# Patient Record
Sex: Female | Born: 1960 | Race: Black or African American | Hispanic: No | Smoking: Never smoker
Health system: Southern US, Community
[De-identification: ages and names within clinical notes are randomized; demographics above are authoritative.]

## PROBLEM LIST (undated history)

## (undated) DIAGNOSIS — R262 Difficulty in walking, not elsewhere classified: Secondary | ICD-10-CM

## (undated) DIAGNOSIS — F79 Unspecified intellectual disabilities: Secondary | ICD-10-CM

## (undated) DIAGNOSIS — I10 Essential (primary) hypertension: Secondary | ICD-10-CM

## (undated) DIAGNOSIS — F209 Schizophrenia, unspecified: Secondary | ICD-10-CM

## (undated) DIAGNOSIS — J9610 Chronic respiratory failure, unspecified whether with hypoxia or hypercapnia: Secondary | ICD-10-CM

## (undated) DIAGNOSIS — E669 Obesity, unspecified: Secondary | ICD-10-CM

## (undated) DIAGNOSIS — E119 Type 2 diabetes mellitus without complications: Secondary | ICD-10-CM

## (undated) DIAGNOSIS — R569 Unspecified convulsions: Secondary | ICD-10-CM

## (undated) DIAGNOSIS — I509 Heart failure, unspecified: Secondary | ICD-10-CM

## (undated) DIAGNOSIS — N390 Urinary tract infection, site not specified: Secondary | ICD-10-CM

## (undated) DIAGNOSIS — R489 Unspecified symbolic dysfunctions: Secondary | ICD-10-CM

## (undated) DIAGNOSIS — E785 Hyperlipidemia, unspecified: Secondary | ICD-10-CM

## (undated) DIAGNOSIS — R1310 Dysphagia, unspecified: Secondary | ICD-10-CM

## (undated) DIAGNOSIS — M6281 Muscle weakness (generalized): Secondary | ICD-10-CM

## (undated) HISTORY — PX: ENDOTRACHEAL INTUBATION EMERGENT: SUR448

## (undated) HISTORY — PX: TRACHEOSTOMY: SUR1362

---

## 2013-07-02 ENCOUNTER — Inpatient Hospital Stay (HOSPITAL_COMMUNITY)
Admission: RE | Admit: 2013-07-02 | Discharge: 2013-07-02 | Disposition: A | Discharge status: Home / Self Care | Payer: Medicare Other | Source: Ambulatory Visit

## 2013-07-02 DIAGNOSIS — G9340 Encephalopathy, unspecified: Secondary | ICD-10-CM

## 2013-07-06 DIAGNOSIS — G9340 Encephalopathy, unspecified: Secondary | ICD-10-CM | POA: Insufficient documentation

## 2013-07-06 NOTE — Procedures (Signed)
ELECTROENCEPHALOGRAM REPORT   Patient: CHAELYN BUNYAN       Room #: [facility] 417 EEG No. ID: [id number] Age: 52 y.o.        Sex: female Referring Physician: In a Report Date:  07/06/2013        Interpreting Physician: Aline Brochure  History: EMILLIE CHASEN is an 52 y.o. female with ventilator dependent respiratory failure, diabetes mellitus, diastolic heart failure, hypertension and obesity, with possible recurrent seizure activity.  Indications for study:  Rule out seizure activity.  Technique: This is an 18 channel routine scalp EEG performed at the bedside with bipolar and monopolar montages arranged in accordance to the international 10/20 system of electrode placement.   Description: Patient was on ventilatory support by way of tracheostomy. Background EEG activity consisted of low amplitude diffuse symmetrical irregular 1-2 Hz delta activity with intermittent superimposed brief runs of generalized 4-5 Hz theta activity. Photic stimulation was not performed. No epileptiform discharges recorded. Patient was noted to become agitated with external noxious stimuli. Significant muscle artifact associated with facial grimacing was noted. There was no simultaneous change in background cerebral activity.  Interpretation: EEG is abnormal with generalized continuous severe slowing of cerebral activity consistent with severe encephalopathy. No evidence of epileptic activity was demonstrated.   Venetia Maxon M.D. [facility] [phone]

## 2013-10-19 ENCOUNTER — Emergency Department (HOSPITAL_COMMUNITY): Payer: Medicare Other

## 2013-10-19 ENCOUNTER — Encounter (HOSPITAL_COMMUNITY): Payer: Self-pay | Admitting: Emergency Medicine

## 2013-10-19 ENCOUNTER — Emergency Department (HOSPITAL_COMMUNITY)
Admission: EM | Admit: 2013-10-19 | Discharge: 2013-10-19 | Disposition: A | Discharge status: Skilled Nursing Facility | Payer: Medicare Other | Attending: Emergency Medicine | Admitting: Emergency Medicine

## 2013-10-19 DIAGNOSIS — F79 Unspecified intellectual disabilities: Secondary | ICD-10-CM | POA: Insufficient documentation

## 2013-10-19 DIAGNOSIS — Z7983 Long term (current) use of bisphosphonates: Secondary | ICD-10-CM | POA: Insufficient documentation

## 2013-10-19 DIAGNOSIS — F209 Schizophrenia, unspecified: Secondary | ICD-10-CM | POA: Insufficient documentation

## 2013-10-19 DIAGNOSIS — Y9389 Activity, other specified: Secondary | ICD-10-CM | POA: Insufficient documentation

## 2013-10-19 DIAGNOSIS — W19XXXA Unspecified fall, initial encounter: Secondary | ICD-10-CM

## 2013-10-19 DIAGNOSIS — Z79899 Other long term (current) drug therapy: Secondary | ICD-10-CM | POA: Insufficient documentation

## 2013-10-19 DIAGNOSIS — I10 Essential (primary) hypertension: Secondary | ICD-10-CM | POA: Insufficient documentation

## 2013-10-19 DIAGNOSIS — J9610 Chronic respiratory failure, unspecified whether with hypoxia or hypercapnia: Secondary | ICD-10-CM | POA: Insufficient documentation

## 2013-10-19 DIAGNOSIS — G40909 Epilepsy, unspecified, not intractable, without status epilepticus: Secondary | ICD-10-CM | POA: Insufficient documentation

## 2013-10-19 DIAGNOSIS — Z930 Tracheostomy status: Secondary | ICD-10-CM | POA: Insufficient documentation

## 2013-10-19 DIAGNOSIS — E119 Type 2 diabetes mellitus without complications: Secondary | ICD-10-CM | POA: Insufficient documentation

## 2013-10-19 DIAGNOSIS — Z87440 Personal history of urinary (tract) infections: Secondary | ICD-10-CM | POA: Insufficient documentation

## 2013-10-19 DIAGNOSIS — Z993 Dependence on wheelchair: Secondary | ICD-10-CM | POA: Insufficient documentation

## 2013-10-19 DIAGNOSIS — Y9210 Unspecified residential institution as the place of occurrence of the external cause: Secondary | ICD-10-CM | POA: Insufficient documentation

## 2013-10-19 DIAGNOSIS — W050XXA Fall from non-moving wheelchair, initial encounter: Secondary | ICD-10-CM | POA: Insufficient documentation

## 2013-10-19 DIAGNOSIS — E669 Obesity, unspecified: Secondary | ICD-10-CM | POA: Insufficient documentation

## 2013-10-19 DIAGNOSIS — I509 Heart failure, unspecified: Secondary | ICD-10-CM | POA: Insufficient documentation

## 2013-10-19 DIAGNOSIS — S3981XA Other specified injuries of abdomen, initial encounter: Secondary | ICD-10-CM | POA: Insufficient documentation

## 2013-10-19 HISTORY — DX: Chronic respiratory failure, unspecified whether with hypoxia or hypercapnia: J96.10

## 2013-10-19 HISTORY — DX: Heart failure, unspecified: I50.9

## 2013-10-19 HISTORY — DX: Muscle weakness (generalized): M62.81

## 2013-10-19 HISTORY — DX: Schizophrenia, unspecified: F20.9

## 2013-10-19 HISTORY — DX: Hyperlipidemia, unspecified: E78.5

## 2013-10-19 HISTORY — DX: Dysphagia, unspecified: R13.10

## 2013-10-19 HISTORY — DX: Urinary tract infection, site not specified: N39.0

## 2013-10-19 HISTORY — DX: Type 2 diabetes mellitus without complications: E11.9

## 2013-10-19 HISTORY — DX: Unspecified symbolic dysfunctions: R48.9

## 2013-10-19 HISTORY — DX: Obesity, unspecified: E66.9

## 2013-10-19 HISTORY — DX: Unspecified intellectual disabilities: F79

## 2013-10-19 HISTORY — DX: Essential (primary) hypertension: I10

## 2013-10-19 HISTORY — DX: Unspecified convulsions: R56.9

## 2013-10-19 HISTORY — DX: Difficulty in walking, not elsewhere classified: R26.2

## 2013-10-19 MED ORDER — KETOROLAC TROMETHAMINE 60 MG/2ML IM SOLN
60.0000 mg | Freq: Once | INTRAMUSCULAR | Status: AC
  Administered 2013-10-19: 60 mg via INTRAMUSCULAR
  Filled 2013-10-19: qty 2

## 2013-10-19 NOTE — ED Notes (Signed)
Pt sitting at the end of bed. Attempted to move pt in bed to lay down. Pt refused. Pt family states that pt is fine sitting at the end of bed. Notified pt family that pt would be safer to lay down. Pt daughter's states that pt is ok where she is. Pt VSS

## 2013-10-19 NOTE — ED Notes (Addendum)
Pt from [facility] via [facility]. Per EMS, pt was in wheelchair, clipped rug on floor and fell out of wheel chair. Pt has no visible injury,did not hit head, no LOC. Pt c/o abd pain after fall. Pt has hx of dysfunctional schizophrenia. Pt acting her normal per facility, crying and whining. Pt is on 2L O2 at all times. Pt in NAD

## 2013-10-19 NOTE — Discharge Instructions (Signed)
Follow up as needed

## 2013-10-19 NOTE — ED Notes (Signed)
[facility] called to transport pt back to facility 

## 2013-10-19 NOTE — ED Notes (Signed)
Pt daughter states that pt is mad because daughter refused to come visit with her today. Daughter states that this is the type of behavior that pt does when she doesn't get her way. Pt daughter also states that pt accuses everyone of harming/threatning her when she doesn't get her way

## 2013-10-19 NOTE — ED Notes (Signed)
Bed: WA18 Expected date:  Expected time:  Means of arrival:  Comments: EMS-fall 

## 2013-10-19 NOTE — ED Notes (Signed)
Pt from [facility]. Pt states that she is having stomach pain after falling onto her knees from whellchair. Pt constantly whining that "she doesn't want to go back, they are mean to me, [place] go to [place] This is normal behavior per EMS from facility. Pt answers questions name and DOB correctly. Pt is in NAD

## 2013-10-19 NOTE — Progress Notes (Signed)
   CARE MANAGEMENT ED NOTE 10/19/2013  Patient:  Theresa Wiley,Theresa Wiley   Account Number:  [id number]  Date Initiated:  10/19/2013  Documentation initiated by:  Edd ArbourGIBBS,KIMBERLY  Subjective/Objective Assessment:   53 yr old female medicare pt without pcp listed Pt states pcp is Theresa Wiley [facility] c/o fall , abdominal pain imaging shows burden of stool HR 103 placed on 2 L O2     Subjective/Objective Assessment Detail:     Action/Plan:   EPIc updated   Action/Plan Detail:   Anticipated DC Date:  10/19/2013     Status Recommendation to Physician:   Result of Recommendation:    Other ED Services  Consult Working Plan    DC Planning Services  Other  PCP issues  Outpatient Services - Pt will follow up    Choice offered to / List presented to:            Status of service:  Completed, signed off  ED Comments:   ED Comments Detail:

## 2013-10-19 NOTE — ED Provider Notes (Signed)
CSN: [id number]     Arrival date & time 10/19/13  0932 History   First MD Initiated Contact with Patient 10/19/13 [phone]     Chief Complaint  Patient presents with  . Fall  . Abdominal Pain   (Consider location/radiation/quality/duration/timing/severity/associated sxs/prior Treatment) Patient is a 53 y.o. female presenting with fall and abdominal pain. The history is provided by the patient (the pt fell out of her wheel chair and has abd pain).  Fall This is a new problem. The current episode started less than 1 hour ago. The problem occurs rarely. The problem has not changed since onset.Associated symptoms include abdominal pain. Pertinent negatives include no chest pain and no headaches. Nothing aggravates the symptoms. Nothing relieves the symptoms.  Abdominal Pain Associated symptoms: no chest pain, no cough, no diarrhea, no fatigue and no hematuria     Past Medical History  Diagnosis Date  . Chronic respiratory failure   . UTI (urinary tract infection)   . CHF (congestive heart failure)   . Ambulatory dysfunction   . Symbolic dysfunction   . Schizophrenia   . Dysphagia   . Diabetes mellitus without complication   . Generalized muscle weakness   . Hypertension   . Hyperlipidemia   . Obesity   . Intellectual disability   . Seizures    Past Surgical History  Procedure Laterality Date  . Tracheostomy    . Endotracheal intubation emergent     No family history on file. History  Substance Use Topics  . Smoking status: Never Smoker   . Smokeless tobacco: Not on file  . Alcohol Use: No   OB History   Grav Para Term Preterm Abortions TAB SAB Ect Mult Living                 Review of Systems  Constitutional: Negative for appetite change and fatigue.  HENT: Negative for congestion, ear discharge and sinus pressure.   Eyes: Negative for discharge.  Respiratory: Negative for cough.   Cardiovascular: Negative for chest pain.  Gastrointestinal: Positive for abdominal pain.  Negative for diarrhea.  Genitourinary: Negative for frequency and hematuria.  Musculoskeletal: Negative for back pain.  Skin: Negative for rash.  Neurological: Negative for seizures and headaches.  Psychiatric/Behavioral: Negative for hallucinations.    Allergies  Review of patient's allergies indicates no known allergies.  Home Medications   Current Outpatient Rx  Name  Route  Sig  Dispense  Refill  . alendronate (FOSAMAX) 10 MG tablet   Oral   Take 10 mg by mouth daily before breakfast. Take with a full glass of water on an empty stomach.         . clonazePAM (KLONOPIN) 1 MG tablet   Oral   Take 1 mg by mouth every 8 (eight) hours.         Marland Kitchen. glycopyrrolate (ROBINUL) 1 MG tablet   Oral   Take 2 mg by mouth every 8 (eight) hours.         Marland Kitchen. lamoTRIgine (LAMICTAL) 200 MG tablet   Oral   Take 200 mg by mouth 2 (two) times daily.         Marland Kitchen. levETIRAcetam (KEPPRA) 750 MG tablet   Oral   Take 750 mg by mouth 2 (two) times daily.         Marland Kitchen. lurasidone (LATUDA) 80 MG TABS tablet   Oral   Take 80 mg by mouth daily.         Marland Kitchen. nystatin (MYCOSTATIN) 100000  UNIT/ML suspension   Oral   Take 5 mLs by mouth every 8 (eight) hours.         . ondansetron (ZOFRAN-ODT) 4 MG disintegrating tablet   Oral   Take 4 mg by mouth every 6 (six) hours as needed for nausea or vomiting.         . Oxycodone HCl 10 MG TABS   Oral   Take 10 mg by mouth every 6 (six) hours as needed (pain).         . potassium chloride (KLOR-CON) 20 MEQ packet   Oral   Take 20 mEq by mouth daily.         . sertraline (ZOLOFT) 100 MG tablet   Oral   Take 200 mg by mouth daily.         Marland Kitchen tolterodine (DETROL) 2 MG tablet   Oral   Take 2 mg by mouth 2 (two) times daily.          BP 110/79  Pulse 103  Temp(Src) 98.2 F (36.8 C) (Oral)  Resp 20  SpO2 100% Physical Exam  Constitutional: She is oriented to person, place, and time. She appears well-developed.  HENT:  Head:  Normocephalic.  Eyes: Conjunctivae and EOM are normal. No scleral icterus.  Neck: Neck supple. No thyromegaly present.  Cardiovascular: Normal rate and regular rhythm.  Exam reveals no gallop and no friction rub.   No murmur heard. Pulmonary/Chest: No stridor. She has no wheezes. She has no rales. She exhibits no tenderness.  Abdominal: She exhibits no distension. There is tenderness. There is no rebound.  Minor epigastric tender  Musculoskeletal: Normal range of motion. She exhibits no edema.  Lymphadenopathy:    She has no cervical adenopathy.  Neurological: She is oriented to person, place, and time. She exhibits normal muscle tone. Coordination normal.  Skin: No rash noted. No erythema.  Psychiatric: She has a normal mood and affect. Her behavior is normal.    ED Course  Procedures (including critical care time) Labs Review Labs Reviewed - No data to display Imaging Review Dg Abd Acute W/chest  10/19/2013   CLINICAL DATA:  Abdominal pain.  EXAM: ACUTE ABDOMEN SERIES (ABDOMEN 2 VIEW & CHEST 1 VIEW)  COMPARISON:  None.  FINDINGS: Cardiomegaly with vascular congestion. Diffuse interstitial prominence throughout the lungs may reflect interstitial edema. No effusions.  Nonobstructive bowel gas pattern. Moderate stool burden throughout the colon. No free air organomegaly. No suspicious calcifications. No acute bony abnormality.  IMPRESSION: Moderate stool burden. No evidence of bowel obstruction or free air.  Cardiomegaly with vascular congestion and possible interstitial edema.   Electronically Signed   By: Charlett Nose M.D.   On: 10/19/2013 10:40    EKG Interpretation   None       MDM   1. Fall, initial encounter    At discharge she no longer had abd pain or tenderness    Benny Lennert, MD 10/19/13 1128

## 2013-10-30 ENCOUNTER — Emergency Department (HOSPITAL_COMMUNITY): Payer: Medicare Other

## 2013-10-30 ENCOUNTER — Emergency Department (HOSPITAL_COMMUNITY)
Admission: EM | Admit: 2013-10-30 | Discharge: 2013-11-06 | Disposition: A | Discharge status: Home / Self Care | Payer: Medicare Other | Attending: Emergency Medicine | Admitting: Emergency Medicine

## 2013-10-30 DIAGNOSIS — J9610 Chronic respiratory failure, unspecified whether with hypoxia or hypercapnia: Secondary | ICD-10-CM | POA: Insufficient documentation

## 2013-10-30 DIAGNOSIS — E669 Obesity, unspecified: Secondary | ICD-10-CM | POA: Insufficient documentation

## 2013-10-30 DIAGNOSIS — F1310 Sedative, hypnotic or anxiolytic abuse, uncomplicated: Secondary | ICD-10-CM | POA: Insufficient documentation

## 2013-10-30 DIAGNOSIS — I509 Heart failure, unspecified: Secondary | ICD-10-CM | POA: Insufficient documentation

## 2013-10-30 DIAGNOSIS — Z79899 Other long term (current) drug therapy: Secondary | ICD-10-CM | POA: Insufficient documentation

## 2013-10-30 DIAGNOSIS — E785 Hyperlipidemia, unspecified: Secondary | ICD-10-CM | POA: Insufficient documentation

## 2013-10-30 DIAGNOSIS — Z228 Carrier of other infectious diseases: Secondary | ICD-10-CM

## 2013-10-30 DIAGNOSIS — F29 Unspecified psychosis not due to a substance or known physiological condition: Secondary | ICD-10-CM | POA: Insufficient documentation

## 2013-10-30 DIAGNOSIS — R4189 Other symptoms and signs involving cognitive functions and awareness: Secondary | ICD-10-CM

## 2013-10-30 DIAGNOSIS — E119 Type 2 diabetes mellitus without complications: Secondary | ICD-10-CM | POA: Insufficient documentation

## 2013-10-30 DIAGNOSIS — F209 Schizophrenia, unspecified: Secondary | ICD-10-CM

## 2013-10-30 DIAGNOSIS — Z87440 Personal history of urinary (tract) infections: Secondary | ICD-10-CM | POA: Insufficient documentation

## 2013-10-30 DIAGNOSIS — F79 Unspecified intellectual disabilities: Secondary | ICD-10-CM | POA: Insufficient documentation

## 2013-10-30 DIAGNOSIS — G40909 Epilepsy, unspecified, not intractable, without status epilepticus: Secondary | ICD-10-CM | POA: Insufficient documentation

## 2013-10-30 DIAGNOSIS — G9340 Encephalopathy, unspecified: Secondary | ICD-10-CM

## 2013-10-30 DIAGNOSIS — R8271 Bacteriuria: Secondary | ICD-10-CM

## 2013-10-30 DIAGNOSIS — I10 Essential (primary) hypertension: Secondary | ICD-10-CM | POA: Insufficient documentation

## 2013-10-30 DIAGNOSIS — IMO0002 Reserved for concepts with insufficient information to code with codable children: Secondary | ICD-10-CM | POA: Insufficient documentation

## 2013-10-30 DIAGNOSIS — Z113 Encounter for screening for infections with a predominantly sexual mode of transmission: Secondary | ICD-10-CM

## 2013-10-30 DIAGNOSIS — F3289 Other specified depressive episodes: Secondary | ICD-10-CM | POA: Insufficient documentation

## 2013-10-30 DIAGNOSIS — F329 Major depressive disorder, single episode, unspecified: Secondary | ICD-10-CM | POA: Insufficient documentation

## 2013-10-30 LAB — COMPREHENSIVE METABOLIC PANEL
ALT: 9 U/L (ref 0–35)
AST: 12 U/L (ref 0–37)
Albumin: 3.5 g/dL (ref 3.5–5.2)
Alkaline Phosphatase: 82 U/L (ref 39–117)
BUN: 11 mg/dL (ref 6–23)
CALCIUM: 9.4 mg/dL (ref 8.4–10.5)
CO2: 31 meq/L (ref 19–32)
Chloride: 99 mEq/L (ref 96–112)
Creatinine, Ser: 0.82 mg/dL (ref 0.50–1.10)
GFR, EST NON AFRICAN AMERICAN: 80 mL/min — AB (ref >90)
GLUCOSE: 92 mg/dL (ref 70–99)
Potassium: 4.5 mEq/L (ref 3.7–5.3)
Sodium: 140 mEq/L (ref 137–147)
Total Bilirubin: <0.2 mg/dL — ABNORMAL LOW (ref 0.3–1.2)
Total Protein: 7.4 g/dL (ref 6.0–8.3)

## 2013-10-30 LAB — URINALYSIS, ROUTINE W REFLEX MICROSCOPIC
BILIRUBIN URINE: NEGATIVE
Glucose, UA: NEGATIVE mg/dL
HGB URINE DIPSTICK: NEGATIVE
KETONES UR: NEGATIVE mg/dL
Nitrite: NEGATIVE
PROTEIN: NEGATIVE mg/dL
Specific Gravity, Urine: 1.018 (ref 1.005–1.030)
Urobilinogen, UA: 0.2 mg/dL (ref 0.0–1.0)
pH: 6.5 (ref 5.0–8.0)

## 2013-10-30 LAB — CBC WITH DIFFERENTIAL/PLATELET
Basophils Absolute: 0 10*3/uL (ref 0.0–0.1)
Basophils Relative: 0 % (ref 0–1)
Eosinophils Absolute: 0.3 10*3/uL (ref 0.0–0.7)
Eosinophils Relative: 6 % — ABNORMAL HIGH (ref 0–5)
HCT: 34.1 % — ABNORMAL LOW (ref 36.0–46.0)
HEMOGLOBIN: 10.3 g/dL — AB (ref 12.0–15.0)
LYMPHS ABS: 0.9 10*3/uL (ref 0.7–4.0)
LYMPHS PCT: 16 % (ref 12–46)
MCH: 24.7 pg — ABNORMAL LOW (ref 26.0–34.0)
MCHC: 30.2 g/dL (ref 30.0–36.0)
MCV: 81.8 fL (ref 78.0–100.0)
MONO ABS: 0.5 10*3/uL (ref 0.1–1.0)
Monocytes Relative: 9 % (ref 3–12)
Neutro Abs: 4 10*3/uL (ref 1.7–7.7)
Neutrophils Relative %: 70 % (ref 43–77)
PLATELETS: 241 10*3/uL (ref 150–400)
RBC: 4.17 MIL/uL (ref 3.87–5.11)
RDW: 15.4 % (ref 11.5–15.5)
WBC: 5.8 10*3/uL (ref 4.0–10.5)

## 2013-10-30 LAB — URINE MICROSCOPIC-ADD ON

## 2013-10-30 LAB — GLUCOSE, CAPILLARY: Glucose-Capillary: 93 mg/dL (ref 70–99)

## 2013-10-30 LAB — RAPID URINE DRUG SCREEN, HOSP PERFORMED
AMPHETAMINES: NOT DETECTED
BARBITURATES: NOT DETECTED
Benzodiazepines: POSITIVE — AB
Cocaine: NOT DETECTED
Opiates: NOT DETECTED
TETRAHYDROCANNABINOL: NOT DETECTED

## 2013-10-30 LAB — SALICYLATE LEVEL

## 2013-10-30 LAB — ETHANOL: Alcohol, Ethyl (B): <11 mg/dL (ref 0–11)

## 2013-10-30 LAB — ACETAMINOPHEN LEVEL

## 2013-10-30 LAB — PRO B NATRIURETIC PEPTIDE: Pro B Natriuretic peptide (BNP): 303.9 pg/mL — ABNORMAL HIGH (ref 0–125)

## 2013-10-30 MED ORDER — SERTRALINE HCL 50 MG PO TABS
200.0000 mg | ORAL_TABLET | Freq: Every day | ORAL | Status: DC
  Administered 2013-10-31 – 2013-11-06 (×7): 200 mg via ORAL
  Filled 2013-10-30 (×8): qty 4

## 2013-10-30 MED ORDER — OXYCODONE HCL 5 MG PO TABS
10.0000 mg | ORAL_TABLET | Freq: Four times a day (QID) | ORAL | Status: DC | PRN
  Administered 2013-10-31: 10 mg via ORAL
  Filled 2013-10-30: qty 2

## 2013-10-30 MED ORDER — LURASIDONE HCL 80 MG PO TABS
80.0000 mg | ORAL_TABLET | Freq: Every day | ORAL | Status: DC
  Administered 2013-10-31 – 2013-11-06 (×7): 80 mg via ORAL
  Filled 2013-10-30 (×8): qty 1

## 2013-10-30 MED ORDER — LAMOTRIGINE 200 MG PO TABS
200.0000 mg | ORAL_TABLET | Freq: Two times a day (BID) | ORAL | Status: DC
  Administered 2013-10-31 – 2013-11-06 (×14): 200 mg via ORAL
  Filled 2013-10-30 (×18): qty 1

## 2013-10-30 MED ORDER — CLONAZEPAM 0.5 MG PO TABS
1.0000 mg | ORAL_TABLET | Freq: Three times a day (TID) | ORAL | Status: DC
  Administered 2013-10-31 – 2013-11-06 (×19): 1 mg via ORAL
  Filled 2013-10-30 (×22): qty 2

## 2013-10-30 MED ORDER — ONDANSETRON 4 MG PO TBDP
4.0000 mg | ORAL_TABLET | Freq: Four times a day (QID) | ORAL | Status: DC | PRN
  Filled 2013-10-30: qty 1

## 2013-10-30 MED ORDER — LEVETIRACETAM 750 MG PO TABS
750.0000 mg | ORAL_TABLET | Freq: Two times a day (BID) | ORAL | Status: DC
  Administered 2013-10-31 – 2013-11-06 (×14): 750 mg via ORAL
  Filled 2013-10-30 (×16): qty 1

## 2013-10-30 NOTE — ED Notes (Signed)
Pt here brought by daughters attempting to get pt placed in [facility].  Hx of Schizophrenia, but family states that is has got progressively worse in last 1.5 months. States that pt moans all night, is having active hallucinations and will not calm down unless her daughters are present. Family wanting to have pt admitted to this facility to stabilize her medications. Family states that pt doesn't express SI to them, but will "hit staff, but only when she is frustrated. It isn't because she wants to hurt anyone." Pt refusing to answer questions for this RN, but will follow commands.

## 2013-10-30 NOTE — ED Notes (Signed)
Notified RN of CBG 93

## 2013-10-30 NOTE — ED Notes (Signed)
PER PT DAUGHTER PT IS CONFINED TO A WHEELCHAIR AT THE NURSING HOME.

## 2013-10-30 NOTE — ED Notes (Signed)
Family at bedside. 

## 2013-10-30 NOTE — ED Notes (Signed)
Transporting patient to new room assignment. 

## 2013-10-30 NOTE — ED Notes (Signed)
Pt's daughters states the pt has not been sleeping well.

## 2013-10-30 NOTE — ED Notes (Addendum)
SPOKE WITH PORCHA AT [facility]. SHE ADVISES THAT SHE UNDERSTOOD THE PT WAS BEING TAKEN TO THE HOSPITAL TONIGHT TO FACILITATE HER GOING [facility]. THE PT WOULD THEN RETURN TO THEIR FACILITY WHEN HER MEDS WERE STABLEIZED. [phone])

## 2013-10-30 NOTE — ED Notes (Signed)
Spoke with Theresa Wiley at [facility] and informed him of the pt's current situation.

## 2013-10-30 NOTE — BH Assessment (Addendum)
[facility] Assessment Progress Note      Spoke to Dr Oletta CohnPolina about the patient prior to TTS consult-HIstory of paranoid schizophrenia, acting out, beligerent, daughter cares for her, but when her daughter is away the patient's behavior becomes worse.  They would like to get her into another facility, but need a referral to do so.

## 2013-10-30 NOTE — ED Notes (Addendum)
CALLED PT NP JULIE [phone]). SHE ADVISES THAT SHE SPENT A NUMBER OF HOURS TODAY TRYING  TO GET PT MOVED TO [facility] [place]. STATES THE "ON CALL DOCTOR" SAID THEY COULD NOT ACCEPT PT FROM THE NURSING HOME HE SUGGESTS SENDING HER TO THE HOSPITAL AND HAVING THE HOSPITAL WORK OUT THE TRANSFER TO THEIR FACILITY. CALLED [facility] AND SPOKE TO CHARGE NURSE AMANDA.([phone]) AND SHE AGAIN STATES THAT THEY ARE NOT AWARE OF THIS CONVERSATION OR THIS PATIENT. SHE STATES THEY DO HAVE BEDS OPEN AND AVAILABLE AT THIS TIME AND REQUESTS WE SEND HER A REFERRAL PACKET. [phone] FAX)

## 2013-10-30 NOTE — BH Assessment (Signed)
[facility] Assessment Progress Note      Faxed referral to [facility].

## 2013-10-30 NOTE — ED Provider Notes (Signed)
CSN: [id number]     Arrival date & time 10/30/13  1359 History   First MD Initiated Contact with Patient 10/30/13 1402     Chief Complaint  Patient presents with  . Psychiatric Evaluation   (Consider location/radiation/quality/duration/timing/severity/associated sxs/prior Treatment) HPI Comments: History of paranoid schizophrenia to the ER from the nursing facility where she resides for increased agitation and aggression. Patient reportedly has had a significant decline over the past 1-1/2 months. She has been experiencing active hallucinations and increased agitation. Patient is crying, moaning and repeatedly says "I want to go home" on examination. She'll not answer any questions appropriately. Level V Caveat due to schizophrenia.    Past Medical History  Diagnosis Date  . Chronic respiratory failure   . UTI (urinary tract infection)   . CHF (congestive heart failure)   . Ambulatory dysfunction   . Symbolic dysfunction   . Schizophrenia   . Dysphagia   . Diabetes mellitus without complication   . Generalized muscle weakness   . Hypertension   . Hyperlipidemia   . Obesity   . Intellectual disability   . Seizures    Past Surgical History  Procedure Laterality Date  . Tracheostomy    . Endotracheal intubation emergent     No family history on file. History  Substance Use Topics  . Smoking status: Never Smoker   . Smokeless tobacco: Not on file  . Alcohol Use: No   OB History   Grav Para Term Preterm Abortions TAB SAB Ect Mult Living                 Review of Systems  Unable to perform ROS: Psychiatric disorder    Allergies  Review of patient's allergies indicates no known allergies.  Home Medications   Current Outpatient Rx  Name  Route  Sig  Dispense  Refill  . alendronate (FOSAMAX) 10 MG tablet   Oral   Take 10 mg by mouth daily before breakfast. Take with a full glass of water on an empty stomach.         . clonazePAM (KLONOPIN) 1 MG tablet   Oral  Take 1 mg by mouth every 8 (eight) hours.         Marland Kitchen glycopyrrolate (ROBINUL) 1 MG tablet   Oral   Take 2 mg by mouth every 8 (eight) hours.         Marland Kitchen lamoTRIgine (LAMICTAL) 200 MG tablet   Oral   Take 200 mg by mouth 2 (two) times daily.         Marland Kitchen levETIRAcetam (KEPPRA) 750 MG tablet   Oral   Take 750 mg by mouth 2 (two) times daily.         Marland Kitchen lurasidone (LATUDA) 80 MG TABS tablet   Oral   Take 80 mg by mouth daily.         Marland Kitchen nystatin (MYCOSTATIN) 100000 UNIT/ML suspension   Oral   Take 5 mLs by mouth every 8 (eight) hours.         . ondansetron (ZOFRAN-ODT) 4 MG disintegrating tablet   Oral   Take 4 mg by mouth every 6 (six) hours as needed for nausea or vomiting.         . Oxycodone HCl 10 MG TABS   Oral   Take 10 mg by mouth every 6 (six) hours as needed (pain).         . potassium chloride SA (K-DUR,KLOR-CON) 20 MEQ tablet   Oral  Take 20 mEq by mouth daily.         . sertraline (ZOLOFT) 100 MG tablet   Oral   Take 200 mg by mouth daily.         Marland Kitchen. tolterodine (DETROL) 2 MG tablet   Oral   Take 2 mg by mouth 2 (two) times daily.          BP 119/58  Pulse 93  Temp(Src) 98.3 F (36.8 C) (Oral)  Resp 18  SpO2 97% Physical Exam  Constitutional: She is oriented to person, place, and time. She appears well-developed and well-nourished. She appears distressed.  HENT:  Head: Normocephalic and atraumatic.  Right Ear: Hearing normal.  Left Ear: Hearing normal.  Nose: Nose normal.  Mouth/Throat: Oropharynx is clear and moist and mucous membranes are normal.  Eyes: Conjunctivae and EOM are normal. Pupils are equal, round, and reactive to light.  Neck: Normal range of motion. Neck supple.  Cardiovascular: Regular rhythm, S1 normal and S2 normal.  Exam reveals no gallop and no friction rub.   No murmur heard. Pulmonary/Chest: Effort normal and breath sounds normal. No respiratory distress. She exhibits no tenderness.  Abdominal: Soft. Normal  appearance and bowel sounds are normal. There is no hepatosplenomegaly. There is no tenderness. There is no rebound, no guarding, no tenderness at McBurney's point and negative Murphy's sign. No hernia.  Musculoskeletal: Normal range of motion.  Neurological: She is alert and oriented to person, place, and time. She has normal strength. No cranial nerve deficit or sensory deficit. Coordination normal. GCS eye subscore is 4. GCS verbal subscore is 5. GCS motor subscore is 6.  Skin: Skin is warm, dry and intact. No rash noted. No cyanosis.  Psychiatric: Her mood appears anxious. Her speech is tangential. She is agitated. She is not aggressive, not hyperactive and not combative. She exhibits a depressed mood.    ED Course  Procedures (including critical care time) Labs Review Labs Reviewed  CBC WITH DIFFERENTIAL - Abnormal; Notable for the following:    Hemoglobin 10.3 (*)    HCT 34.1 (*)    MCH 24.7 (*)    Eosinophils Relative 6 (*)    All other components within normal limits  COMPREHENSIVE METABOLIC PANEL - Abnormal; Notable for the following:    Total Bilirubin <0.2 (*)    GFR calc non Af Amer 80 (*)    All other components within normal limits  URINE RAPID DRUG SCREEN (HOSP PERFORMED) - Abnormal; Notable for the following:    Benzodiazepines POSITIVE (*)    All other components within normal limits  SALICYLATE LEVEL - Abnormal; Notable for the following:    Salicylate Lvl <2.0 (*)    All other components within normal limits  ETHANOL  ACETAMINOPHEN LEVEL  GLUCOSE, CAPILLARY  PRO B NATRIURETIC PEPTIDE  URINALYSIS, ROUTINE W REFLEX MICROSCOPIC   Imaging Review Dg Chest Port 1 View  10/30/2013   CLINICAL DATA:  Shortness of breath.  EXAM: PORTABLE CHEST - 1 VIEW  COMPARISON:  Chest in two views abdomen 10/19/2013.  FINDINGS: Cardiomegaly and vascular congestion are again seen. There is no consolidative process, pneumothorax or effusion. Scar in the right mid lung is unchanged.   IMPRESSION: Cardiomegaly and vascular congestion.  No acute finding.   Electronically Signed   By: Drusilla Kannerhomas  Dalessio M.D.   On: 10/30/2013 15:02    EKG Interpretation    Date/Time:  Tuesday October 30 2013 14:56:04 EST Ventricular Rate:  98 PR Interval:  189 QRS  Duration: 88 QT Interval:  376 QTC Calculation: 480 R Axis:   114 Text Interpretation:  Sinus rhythm Right axis deviation No previous tracing Confirmed by POLLINA  MD, CHRISTOPHER ([id number]) on 10/30/2013 4:06:22 PM            MDM  Diagnosis: Thought disorder  Patient comes to the for increased agitation, intermittent aggression, increased hallucinations. She has history of schizophrenia. She has had a progressive decline over a period of more than one month. Her workup in the ER today has been unremarkable, patient is medically clear for psychiatric evaluation.  Gilda Crease, MD 10/30/13 1710

## 2013-10-30 NOTE — ED Notes (Signed)
[facility] [place], [place]: Per family Md Raynelle FanningJulie recommended this to family. Family requests this facility and POA ( pt's sister) lives in [place].   Daughter, [name]: [phone]

## 2013-10-30 NOTE — ED Notes (Signed)
Tele psych machine set up in room. Family at bedside.

## 2013-10-30 NOTE — ED Notes (Signed)
Family spoke with Marchelle Fol[facility]Amanda from [facility], family had questions and concerns that they needed to be addressed with Marchelle FolksAmanda.

## 2013-10-30 NOTE — BH Assessment (Signed)
[facility] Assessment Progress Note     Pt was declined by the family's preferred facility.  This Clinical research associatewriter attemtped to contact the facility for further explanation.  Glenda, RN, stated that the patient was declined due to no criteria, SI/HI/AVH and that the family is looking for placement.  This Clinical research associatewriter explained the patient is experiencing symptoms of psychosis and that the family is only looking for placement for treatment near the pt's POA, not looking for permanent placement.  She encouraged me to refax the referral, which I did, after amending the note to ensure that it was clear that patient meets criteria for crisis stabilization and that her family is not seeking permanent placement.

## 2013-10-30 NOTE — ED Notes (Signed)
Contacted [facility] about pt's status. Pt is currently awaiting to hear back from [facility] about bed placement. [facility] facilitating that process. Will await to hear about bed assignment.

## 2013-10-30 NOTE — ED Notes (Signed)
Daughter's phone number:  Archie Pattenonya: [phone] Michelle: [phone] **

## 2013-10-30 NOTE — ED Notes (Signed)
[facility]. Family at bedside.

## 2013-10-30 NOTE — ED Notes (Signed)
[facility] They do not have any information on who this patient is. [phone]

## 2013-10-30 NOTE — ED Notes (Addendum)
[facility] to be evaluated for schizophrenia to transferred to another facility. Daughter is at bedside. EMS administered Haldol 5mg  IM, Haldol 5mg  IV.

## 2013-10-30 NOTE — ED Notes (Signed)
Spoke with Marchelle FolksAmanda at [facility], unable to accept pt. Pt does not meet criteria.
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
## 2013-10-31 DIAGNOSIS — F319 Bipolar disorder, unspecified: Secondary | ICD-10-CM

## 2013-10-31 DIAGNOSIS — F200 Paranoid schizophrenia: Secondary | ICD-10-CM

## 2013-10-31 MED ORDER — IBUPROFEN 400 MG PO TABS: 600.0000 mg | ORAL_TABLET | Freq: Three times a day (TID) | ORAL | Status: DC | PRN

## 2013-10-31 MED ORDER — ACETAMINOPHEN 325 MG PO TABS
650.0000 mg | ORAL_TABLET | ORAL | Status: DC | PRN
  Administered 2013-11-06: 650 mg via ORAL
  Filled 2013-10-31: qty 2

## 2013-10-31 MED ORDER — ALUM & MAG HYDROXIDE-SIMETH 200-200-20 MG/5ML PO SUSP: 30.0000 mL | ORAL | Status: DC | PRN

## 2013-10-31 NOTE — ED Notes (Addendum)
Pt awake crying for help, pt states her left hand is hurting her, IV removed.

## 2013-10-31 NOTE — ED Notes (Signed)
Patients daughters have arrived to visit patient. They were informed by charge and security of the hours. They are very upset that they cannot come back and visit now. They have requested to speak to nursing supervisor.

## 2013-10-31 NOTE — ED Notes (Signed)
Pt daughter aware the psych eval will be at 1230

## 2013-10-31 NOTE — ED Notes (Signed)
Family is now at bedside.

## 2013-10-31 NOTE — BH Assessment (Signed)
10/31/2013  Disposition 10/30/13 stated to refer patient to a gero psych placement-family was told by MD at nursing home not to go [place]. Patient is not elgible for [facility] due to age. Must be 55+. Family requesting placement close to [address] area [address] b/c this is where POA is located. [facility] @ request of family but declined due to medical acuity.   Additionally, Patient has a intellectual disability. This patient may require placement at a facility that accepts MR/DD patient's such as [facility]. MHT made aware. Pt does not meet criteria for a diversion. Will request a TP for further dispositioning recommendations.   No extender is available at this time. Aggie is in a treatment team meeting and Vernona RiegerLaura is seeing patient's on the [address] hall unit. Writer will continue seeking a extender to complete the TP.

## 2013-10-31 NOTE — ED Provider Notes (Signed)
Patient awaiting placement. No issues during my shift.  Darlys Galesavid Chae Shuster, MD 10/31/13 [phone]

## 2013-10-31 NOTE — ED Notes (Signed)
Pt woke up stating help me, pt reassured she is OK. Pt went back to sleep.

## 2013-10-31 NOTE — ED Notes (Signed)
Theresa FolksAmanda  From behavior heath call stating that  counsleor were going to further evaulated whether they could accept this pt at this time

## 2013-10-31 NOTE — ED Notes (Signed)
Pt very cooperative with taking her medications. Pt resting again.

## 2013-10-31 NOTE — ED Notes (Signed)
Pt awake and calling out for help. [facility] & [facility].

## 2013-10-31 NOTE — ED Notes (Signed)
Theresa CroissantBetty CurryMelina Wiley: POA and sister- [phone] and [phone].

## 2013-10-31 NOTE — Progress Notes (Signed)
Theresa Wiley, MHT provided follow up with recommended disposition for patient. Writer contacted [name] spoke with [name] who reports that referral was received but has been declined for review due to age criteria [name](Gero Psych ) must be 55+, as well as [name]. Patient does not meet [facility] admission due to age. Patient has history of medical issues that may interfere with other general psych admissions. Writer informed [name], RN attending and informed of this. Writer requested [name] to order tele psych by extender for further evaluation and recommendation. Writer spoke with patients daughter [name] [name] and informed of current status.  [facility] was [facility] for review by Clinical research associatewriter. Spoke with [name] in admissions who reports being at capacity with no expected discharges.

## 2013-10-31 NOTE — Progress Notes (Signed)
Call [place] at [facility] stating pt has been declined d/t not having programming for intellectual disability.   Tomi BambergerMariya Navada Osterhout, MHT

## 2013-10-31 NOTE — Progress Notes (Signed)
Patient will receive a Tele Assessment with the patient at 12:30pm with the NP, Aggie. Writer informed the nurse Drinda Butts(Annette)

## 2013-10-31 NOTE — ED Notes (Signed)
Marchelle Folksmanda from [facility] called and informed us that no one is working tonight to place the pt will have to wait for the morning. Inform us that [facility] is going to re-evaluate the pt's chart and will consider accepting the pt. Informed [facility] [place] is another option, pt was not originally OK with sending pt to [facility] because their NP informed them not to send the pt there, however after speaking the the POA [place] is an option if it needs to be.

## 2013-10-31 NOTE — Progress Notes (Signed)
The following facilities were contacted regarding gero psych placement:  [facility] per [facility] beds available, referral [facility]- per [facility] at capacity [facility]- per Dorene SorrowJerry only 1 female gero psych bed available but must be at least 55 yoa [place]- per Pam beds available, referral faxed   Tomi BambergerMariya Keita Wiley, Theresa Wiley

## 2013-10-31 NOTE — Progress Notes (Signed)
Writer asked the MHT Tech to inform the nurse Drinda Butts(Annette) and the family that the Tele Psych time has to be moved to 1:00pm with the NP, Aggie.

## 2013-11-01 ENCOUNTER — Encounter (HOSPITAL_COMMUNITY): Payer: Self-pay | Admitting: Emergency Medicine

## 2013-11-01 MED ORDER — CEFTRIAXONE SODIUM 1 G IJ SOLR
1.0000 g | INTRAMUSCULAR | Status: DC
  Administered 2013-11-01: 1 g via INTRAMUSCULAR
  Filled 2013-11-01: qty 10

## 2013-11-01 MED ORDER — CIPROFLOXACIN HCL 500 MG PO TABS
500.0000 mg | ORAL_TABLET | Freq: Two times a day (BID) | ORAL | Status: DC
  Administered 2013-11-01 – 2013-11-03 (×4): 500 mg via ORAL
  Filled 2013-11-01 (×4): qty 1

## 2013-11-01 MED ORDER — DEXTROSE 5 % IV SOLN: 1.0000 g | INTRAVENOUS | Status: DC

## 2013-11-01 MED ORDER — CIPROFLOXACIN HCL 500 MG PO TABS: 500.0000 mg | ORAL_TABLET | Freq: Once | ORAL | Status: DC

## 2013-11-01 MED ORDER — LIDOCAINE HCL (PF) 1 % IJ SOLN
INTRAMUSCULAR | Status: AC
  Administered 2013-11-01: 2.1 mL
  Filled 2013-11-01: qty 5

## 2013-11-01 NOTE — ED Notes (Signed)
Pt's daughter called to check on pt, asking for medical list and plan of care. She was informed that no pt information can be discus on the phone, she was information

## 2013-11-01 NOTE — ED Notes (Signed)
Pt request to call sister and daughter. No answer. Pt informed will call in 1 hour

## 2013-11-01 NOTE — ED Notes (Signed)
[facility] with [facility] states that we are waiting for inpatient placement, because pt has MR, hospital has denied acceptance.

## 2013-11-01 NOTE — Progress Notes (Signed)
Intake packets were faxed to the following hospitals: [facility], [facility], [facility], [facility], and [facility].  Follow-up to be done on 11/02/13 after [facility]:  [facility]), [facility]  The following hospitals were at capacity:  [facility], [facility], [facility], and [facility]. At the nurse's request at [facility], this staff called the [facility] to check on the plan for pt. AC's response provided to nursing staff [facility] regarding pt's medical history and present condition.  Inquiries continue for a bed.
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
## 2013-11-01 NOTE — ED Notes (Signed)
Pt able to speak with Archie Pattenonya, daughter and Kathie RhodesBetty, sister

## 2013-11-01 NOTE — ED Notes (Signed)
FAMILY NUMBERS  TANYA (daughter):[phone] BETTY (daughter):[phone]

## 2013-11-02 NOTE — ED Notes (Signed)
Social worker is processing pt paperwork to get her moved back to her nursing home

## 2013-11-02 NOTE — ED Notes (Signed)
Spoke with Wandra MannanZack Brooks AD CSW re: his [facility] with [facility].  [facility], it was their understanding that family checked pt out today, as they cleaned out her room, removed all her belongings and [facility] to [facility] and then to a psych facility.  Pt has been d/c'ed from [facility].  CSW discussed this with pt's daughter who stated that pt's POA, her (pt's) sister is responsible for that.  Daughter asked CSW to call pt's POA and discuss with her.  Spoke with pt's sister Kathie Rhodes(Betty) who agreed that she [facility] because she wants [facility] placed at [facility] [place].  Explained to pt that this may not be possible from the ED because of pt's payor sources/[place] screen etc.  CSW advised that Kathie RhodesBetty to [facility] re: bed availability/accepting pt back etc as CSW may not be able to secure bed at [facility].    Sister calls facility then CSW.  States that she spoke with Tonya at the facility who stated that she could not accept pt back without discussing with ALF admistration on Monday,  1/19.  Sister states that pt's brother [facility] if bed available, but that no one was able to care for pt over weekend.  CSW to explore [facility] bed availability and facilitate [facility] to [facility] that facility or [facility] Monday[facility].  W/e CSW to be updated.
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
## 2013-11-02 NOTE — ED Notes (Signed)
PATIENT FOUND WALKING HOLDING ONTO SIDE RAIL STATING SHE IS GOING TO THE BATHROOM. PT TEARFUL STATING THE NURSE IS MEAN TO HER . PATIENT IS UPSET THAT SHE HAS NOT BEEN ABLE TO KEEP NURSING AT HER BEDSIDE SO SHE CAN CONTINUE TO TALK ABOUT SEEING THE DOCTOR, CALLING HER FAMILY, RIDING IN THE RESCUE SQUAD, THAT SHE DOESN'T WANT TO GO THE REST HOME, THAT SHE WANTS TO GO TO [place] ETC.  EXPLAINED TO PT AGAIN AND AGAIN THE PLAN OF CARE. EXPLAINED TO PT NUMEROUS TIMES THAT WE ALL HAVE RULES WE HAVE TO FOLLOW. PATIENT TEARFUL AND REFUSES TO HEAR EXPLANATIONS THAT ARE NOT AGREEABLE TO HER.

## 2013-11-02 NOTE — Progress Notes (Signed)
CSW received referral from RN to assist in [facility] as [facility] and determined that patient does not need inpatient psychiatric treatment at this time. CSW contacted facility and spoke with Porscha who stated that facility would take patient back if family was agreeable. RN relayed that she spoke with patient's sister Kathie RhodesBetty who is HCPOA and that she is agreeable. CSW spoke with patient's daughter Archie Pattenonya [phone] who is agreeable to patient's return and willing to sign paperwork at facility.  CSW updated FL2 and faxed to facility. Per Porscha, facility not able to accept patient until Monday 11/05/12 due to "medication changes and lack of oxygen"- pharmacy closed until Monday. CSW thanked Porscha for her assistance. Updated RN [name].   Samuella BruinKristin Tijah Hane, MSW, LCSWA Clinical Social Worker [facility]. [phone]

## 2013-11-02 NOTE — ED Provider Notes (Signed)
11/02/13 3:52 PM  Pt seen again by psychiatry Armandina StammerAgnes Nwoko, NP.  Patient reports she is now feeling better and has a place to stay. Plan is now to discharge patient home. This has been discussed with Dr. Dub MikesLugo.  Confirmed this with Darlyn Readina Tat, behavioral health AC.  I was not directly involved in this patient's care.  Layla MawKristen N Jentri Aye, DO 11/02/13 1605

## 2013-11-02 NOTE — ED Provider Notes (Addendum)
3:52 PM Per psychiatry discharge planning, pt will be d/c back to her facility.  I was otherwise not involved in pt care or discharge planning.  8:17 PM Pt will not not able to be accepting back to facility until Monday.   1. Thought disorder   2. Schizophrenia      Shanna CiscoMegan E Docherty, MD 11/02/13 1553  Shanna CiscoMegan E Docherty, MD 11/02/13 2017

## 2013-11-02 NOTE — ED Notes (Signed)
[facility] until Monday because the pt needs oxygen, and there are 4 meds on her med list they do not have in stock

## 2013-11-02 NOTE — ED Notes (Signed)
CALLED TO CHECK ON DELAY IN GETTING PT REASSESSED BY EXTENDER. THEY WILL CALL BACK. PT DAUGHTER IS HERE.

## 2013-11-02 NOTE — ED Notes (Signed)
CSW spoke with pt's daughter re: the possibility of taking pt home and caring for her over the weekend, until [facility] could accept pt back on 1/19.  Per daughter, she cannot provide 24 hour care and would be afraid to leave pt alone when she had to go to work.  No other family available either, per dtr.  Wandra MannanZack Brooks, AD CSW aware of facility's refusal to take pt back today/over w/e.  CSW will continue to follow for disposition and facilitate ALF tx.

## 2013-11-02 NOTE — ED Notes (Signed)
PATIENT YELLING OUT AT ANYONE THAT WALKS PAST HER ROOM ASKING THEM TO PLEASE COME IN THERE AND TALK TO HER.

## 2013-11-02 NOTE — ED Notes (Signed)
Pt given Apple juice 

## 2013-11-02 NOTE — ED Notes (Signed)
Pt given 2 apple juices to drink.

## 2013-11-02 NOTE — ED Notes (Signed)
Patient has been on call bell about every 15-20 minutes. She has asked to speak to the "front desk lady" when asked whom she wanted she states "i dont know who she is" . She asks for staff to come in and sit and chat with her. She has asked for the doctor to come by and talk to her. She has been informed that we are working to get a doctor from psychiatry to talk to her and she states "thats all right". Other calls when answered she sayes "nevermind". There have been no documented or reported behavior outbursts with this patient since arrival. She has reportedly been sleeping well during the night.

## 2013-11-02 NOTE — ED Notes (Addendum)
CALLED BETTY AND SPOKE TO HER ABOUT PT DISCHARGE SITUATION. EXPLAINED THAT PT DOES HAVE A FACILITY TO GO BACK TO AND THAT WE ARE OBLIGATED TO SEND HER AND NOT HOLD HER IN THE EMERGENCY ROOM. INFORMED BETTY THAT WE DID SPEAK WITH [facility] AND RHONDA CANNOT ACCEPT [facility] TODAY. EXPLAINED THAT PT WOULD HAVE TO BE REVIEWED MEDICALLY AND FINANCIALLY BEFORE SHE COULD BE ACCEPTED AND TRANSFERRED TO THE FACILITY. STRESSED THAT THE SOCIAL WORKER AT [facility] CAN HELP GET HER MOVED.  BETTY DID VERBALIZE UNDERSTANDING ABOUT THE PROCESS AND VERBALIZED AGREEMENT TO PT [facility]

## 2013-11-02 NOTE — ED Notes (Signed)
PT POA BETTY HAS CALLED AFTER HAVING A CONVERSATION WITH PT DAUGHTER AND PT WITH THE NAME OF A FACILITY IN [place]. CALLED THE FACILITY AND SPOKE TO RHONDA [phone] THEY ARE NOT PREPARED TO TAKE PT AT THIS TIME. THEY WOULD NEED TO REVIEW HER RECORDS ETC. SHE DID ADVISE THAT IF THE PT DID RETURN TO HER RETIREMENT HOME THEY COULD WORK WITH HER SOCIAL WORKER AND POSSIBLY GET PT MOVED TO THEIR FACILITY

## 2013-11-02 NOTE — Progress Notes (Signed)
CSW Assistant Director Wandra MannanZack Brooks made aware of patient situation.   Samuella BruinKristin Rashon Rezek, MSW, LCSWA Clinical Social Worker [facility]. [phone]

## 2013-11-03 LAB — URINE CULTURE

## 2013-11-03 LAB — GLUCOSE, CAPILLARY: GLUCOSE-CAPILLARY: 93 mg/dL (ref 70–99)
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
## 2013-11-03 NOTE — Progress Notes (Signed)
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 Nurse Lelis agreed to notify patients physician of this CRE tonight. Please feel free to contact Infection Prevention at [phone] with questions or concerns.

## 2013-11-03 NOTE — ED Notes (Signed)
Pt continually asking for RN, asking for other RN, for Doctor on the screen, Doctor from yesterday. Pt needs have been met. Pt told is not going home today. Has been assisted with bed bath, paper scrubs changed. Sitting in recliner chair, watching TV.

## 2013-11-03 NOTE — ED Notes (Signed)
Assisted pt to BR with Huntley DecSara steady. Pt tolerated well. Pt cooperative and states she feels good

## 2013-11-03 NOTE — Evaluation (Signed)
Physical Therapy Evaluation Patient Details Name: Theresa Wiley MRN: [id number] DOB: Sep 22, 1961 Today's Date: 11/03/2013 Time: [id number] PT Time Calculation (min): 27 min  PT Assessment / Plan / Recommendation History of Present Illness  Patient is a 53 yo female with history of paranoid schizophrenia to the ER from the nursing facility where she resides for increased agitation and aggression. Patient reportedly has had a significant decline over the past 1-1/2 months. She has been experiencing active hallucinations and increased agitation.   Clinical Impression  Patient presents with problems listed below.  Will benefit from acute PT to maximize functional independence prior to discharge.  Recommend SNF at discharge for continued therapy.    PT Assessment  Patient needs continued PT services    Follow Up Recommendations  SNF    Does the patient have the potential to tolerate intense rehabilitation      Barriers to Discharge        Equipment Recommendations  Rolling walker with 5" wheels    Recommendations for Other Services     Frequency Min 2X/week    Precautions / Restrictions Precautions Precautions: Fall Restrictions Weight Bearing Restrictions: No   Pertinent Vitals/Pain       Mobility  Transfers Overall transfer level: Needs assistance Equipment used: Rolling walker (2 wheeled) Transfers: Sit to/from Stand Sit to Stand: Min assist General transfer comment: Verbal cues for hand placement.  Assist to rise to standing and for balance. Ambulation/Gait Ambulation/Gait assistance: Min assist Ambulation Distance (Feet): 46 Feet Assistive device: Rolling walker (2 wheeled) Gait Pattern/deviations: Step-through pattern;Decreased step length - right;Decreased step length - left;Trunk flexed Gait velocity: Slow gait speed General Gait Details: Verbal cues for safe use of RW.  Cues to keep feet inside RW and to stand upright.    Exercises     PT Diagnosis:  Difficulty walking;Abnormality of gait;Generalized weakness;Altered mental status  PT Problem List: Decreased strength;Decreased activity tolerance;Decreased balance;Decreased mobility;Decreased cognition;Decreased knowledge of use of DME;Decreased safety awareness PT Treatment Interventions: DME instruction;Gait training;Functional mobility training;Cognitive remediation;Patient/family education     PT Goals(Current goals can be found in the care plan section) Acute Rehab PT Goals Patient Stated Goal: To walk PT Goal Formulation: With patient Time For Goal Achievement: 11/17/13 Potential to Achieve Goals: Good  Visit Information  Last PT Received On: 11/03/13 Assistance Needed: +1 History of Present Illness: Patient is a 53 yo female with history of paranoid schizophrenia to the ER from the nursing facility where she resides for increased agitation and aggression. Patient reportedly has had a significant decline over the past 1-1/2 months. She has been experiencing active hallucinations and increased agitation.        Prior Functioning  Home Living Family/patient expects to be discharged to:: Skilled nursing facility Living Arrangements: Other (Comment) (From SNF) Prior Function Level of Independence: Needs assistance Gait / Transfers Assistance Needed: Using w/c primarily.  Some ambulation with assist ADL's / Homemaking Assistance Needed: Assist for meal prep, housekeeping.  Set-up for bathing Communication Communication: No difficulties    Cognition  Cognition Arousal/Alertness: Awake/alert Behavior During Therapy: Impulsive;Restless;Anxious Overall Cognitive Status: No family/caregiver present to determine baseline cognitive functioning Area of Impairment: Orientation;Attention;Memory;Safety/judgement;Problem solving Orientation Level: Disoriented to;Place;Time;Situation Current Attention Level: Sustained Memory: Decreased short-term memory Safety/Judgement: Decreased  awareness of deficits;Decreased awareness of safety Problem Solving: Slow processing;Difficulty sequencing;Requires verbal cues    Extremity/Trunk Assessment Upper Extremity Assessment Upper Extremity Assessment: Overall WFL for tasks assessed Lower Extremity Assessment Lower Extremity Assessment: Generalized weakness   Balance Balance  Overall balance assessment: Needs assistance Standing balance support: Single extremity supported Standing balance-Leahy Scale: Fair  End of Session PT - End of Session Equipment Utilized During Treatment: Gait belt Activity Tolerance: Patient limited by fatigue Patient left: in chair;with call bell/phone within reach;with nursing/sitter in room Nurse Communication: Mobility status  GP Functional Assessment Tool Used: Clinical judgement Functional Limitation: Mobility: Walking and moving around Mobility: Walking and Moving Around Current Status (Z6109): At least 20 percent but less than 40 percent impaired, limited or restricted Mobility: Walking and Moving Around Goal Status [phone]): At least 1 percent but less than 20 percent impaired, limited or restricted   Vena Austria 11/03/2013, 9:46 AM Durenda Hurt. Renaldo Fiddler, [facility] Acute Rehab Services Pager [phone]

## 2013-11-03 NOTE — Progress Notes (Signed)
11/03/13 1750  OT Visit Information  Last OT Received On 11/03/13  Reason Eval/Treat Not Completed OT screened, no needs identified, will sign off. Spoke with PT, no acute OT needs at this time. Pt planning to d/c to SNF.      Jenell MillinerLindsey Freemon Binford, OTR/L [phone]

## 2013-11-03 NOTE — Progress Notes (Signed)
Weekend CSW made aware by RN that patient's family is here visiting at the bedside. CSW met with patient, her daughter Kenney Houseman, and 2 granddaughters. CSW updated patient and daughter on plan- patient to hopefully return to [facility] on Monday 11/05/13, and the family can then facilitate a transfer to another facility if they wish. Patient appeared lethargic, with her head down during the conversation. Daughter agreeable to plan. No questions at this time.  CSW to follow for discharge planning and support.   Tilden Fossa, MSW, [place] Clinical Social Worker [facility] Emergency Dept. [phone]'

## 2013-11-03 NOTE — Progress Notes (Signed)
    [facility]   I was called by Dr. 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  It is NOT clear to me why the patient had urine analysis (which did have WBC) and culture done.  She appears to have been admitted to ED for an acute psychotic episode.  I do not see any documentation of patient complaining of dysuria, flank pain or abdominal pain, suprapubic pain. i do not see any documentation of a fever.  WBC fwiw is normal. Vital signs appear normal. Description given to me by Dr. Radford PaxBeaton was that of an acutely psychotic pt but otherwise medically stable patient.   I suppose that one could consider idea that she has a complicated UTI with delirium but I am skeptical of this.  IF ONE WERE TO TREAT HER FOR A SYMPTOMATIC UTI  One could use TETRACYCLINE 500mg  orally every six hours for 3 to 7 days  Alternatively one could use  FOSFOMYCIN 3 GRAMS once and if necessary repeated in 3 days   I WOULD NOT USE IV COLISTIN AS RISK OF NEPHROTOXICITY WOULD BE higher and I am not convinced she has an actual UTI  I SUSPECT THIS IS SIMPLY ASYMPTOMATIC COLONIZATION AND THAT PT  CAN SIMPLY BE OBSERVED  IT SOUNDS AS IF TREATMENT OF HER ACUTE PSYCHOTIC EPISODE IS THE FIRST ORDER OF business.  SHE DOES NEED TO BE ON CONTACT PRECAUTIONS FOR MDR ORGANISMS WHICH IN THIS CASE WOULD BE GOWNS, GLOVES WITH PROPER HAND HYGEINE.   IF THERE ARE OTHER ISSUES RELATED TO INFECTION PREVENTION POLICY I WOULD RECOMMEND CALLING THE INFECTION PREVENTION RN. TO MY KNOWLEDGE THERE ARE NOT OTHER EXTRA MEASURES BEYOND GLOVES, GOWN AND HYGEINE AS PER MRSA, VRE, ESBL ORGANISMS

## 2013-11-03 NOTE — ED Notes (Signed)
Pt had soft brown stool and urine in scrubs.  Pt cleaned and dressed in new scrubs. Pt tolerated well. States that she had a stomach pain and had to go real "quick". Pt called Theresa Wiley and talked on the phone to her about the nursing home that she is going to on Monday

## 2013-11-03 NOTE — ED Notes (Signed)
Pt given snack of graham crackers and cranberry juice

## 2013-11-03 NOTE — ED Notes (Signed)
This RN spoke with Infectious Disease, and was told that the pt's urine was positive for CRE, and needs placed on contact precautions. Radford PaxBeaton, MD is aware.

## 2013-11-03 NOTE — Progress Notes (Signed)
Weekend CSW contacted [facility] in [place], [place] regarding patient referral to facility. CSW spoke with LPN Murrell Converseeggy Weathers who stated that the Admissions Coordinator/Administrator would be available to review paperwork on Monday 11/05/13. CSW left contact information to be passed along to Director of Nursing (DON) Annice PihJackie Wittington.  CSW received call back from [facility] LPN Murrell Converseeggy Weathers who spoke with DON. DON not in office but requested that CSW fax over paperwork for review. CSW faxed over patient FL2, H&P, medication list, and facesheet to [phone], as well as sent this information electronically to facility through [facility].   CSW attempted to submit PASARR screening for patient but was not able to do so at this time- Patient will not need PASARR to return to [facility] per discussion with Porscha on 11/02/13.  CSW to follow up with [facility] regarding admission.   Samuella BruinKristin Silvie Obremski, MSW, LCSWA Clinical Social Worker [facility] Emergency Dept. [phone]

## 2013-11-03 NOTE — ED Notes (Signed)
Pt resting with eyes closed. No complaints voiced at this time

## 2013-11-03 NOTE — ED Notes (Signed)
Pt refusing to wake up to receive medications. This RN can see the pt's eye's moving, and the pt rolled over, but she will not acknowledge this RN.

## 2013-11-03 NOTE — Progress Notes (Signed)
Clinical Social Work Department CLINICAL SOCIAL WORK PLACEMENT NOTE 11/03/2013  Patient:  Theresa Wiley,Theresa Wiley  Account Number:  [id number] Admit date:  10/30/2013  Clinical Social Worker:  Samuella BruinKRISTIN Niyati Heinke, Theresia MajorsLCSWA  Date/time:  11/03/2013 04:43 PM  Clinical Social Work is seeking post-discharge placement for this patient at the following level of care:   ASSISTED LIVING/REST HOME   (*CSW will update this form in Epic as items are completed)     Patient/family provided with [facility] Department of Clinical Social Work's list of facilities offering this level of care within the geographic area requested by the patient (or if unable, by the patient's family).  11/03/2013  Patient/family informed of their freedom to choose among providers that offer the needed level of care, that participate in Medicare, Medicaid or managed care program needed by the patient, have an available bed and are willing to accept the patient.    Patient/family informed of [facility]' ownership interest in [facility], as well as of the fact that they are under no obligation to receive care at this facility.  PASARR submitted to EDS on  PASARR number received from EDS on   FL2 transmitted to all facilities in geographic area requested by pt/family on  11/03/2013 FL2 transmitted to all facilities within larger geographic area on 11/03/2013  Patient informed that his/her managed care company has contracts with or will negotiate with  certain facilities, including the following:     Patient/family informed of bed offers received:   Patient chooses bed at  Physician recommends and patient chooses bed at    Patient to be transferred to  on   Patient to be transferred to facility by   The following physician request were entered in Epic:   Additional Comments: Patient from [facility], cannot take patient back until 11/05/2013. Per HCPOA/sister Betty's request, CSW has faxed patient  information to [facility], [facility] and [facility] for review as well. Patient to return to whichever facility offers bed, although patient will need PASARR for any other facility other than [facility].   Samuella BruinKristin Geno Sydnor, MSW, LCSWA Clinical Social Worker [facility] Emergency Dept. [phone]

## 2013-11-03 NOTE — ED Notes (Signed)
Pt voided and had a BM while in the BR.

## 2013-11-03 NOTE — ED Notes (Signed)
Phoned pharmacy to attempt to locate pt daily medications

## 2013-11-03 NOTE — Progress Notes (Signed)
Weekend CSW received call from patient's sister and Maximino GreenlandHCPOA Betty stating that she wishes for CSW to contact [facility] in [place] and [facility] in [place] for placement. CSW informed sister that she would attempt to contact these facilities, but that sister should be prepared for patient to return to [facility] as patient will be going to whichever facility offers a bed on Monday 11/05/13. CSW also informed sister Kathie RhodesBetty that patient can be transferred to a preferred facility from [facility]. Patient's sister Kathie RhodesBetty stated that she understood. CSW informed sister that CSW will keep her informed with any updates.   Samuella BruinKristin Laina Guerrieri, MSW, LCSWA Clinical Social Worker [facility] Emergency Dept. [phone]

## 2013-11-03 NOTE — ED Notes (Signed)
Patient refused to take her medication.  She states that it was " poison not medicine and I will not take it". RN tried to reassure her that it was not poison but it was unsuccessful. Asked another RN to try to get her to take her medicine.  This was unsuccessful.

## 2013-11-03 NOTE — ED Notes (Signed)
Pt placed on contact precautions. Pt unable to understand precaution

## 2013-11-03 NOTE — ED Notes (Signed)
Pt had a bowel movement on herself. This RN and Aura Campsonna H, RN cleaned the pt, and assissted her back to her chair. Pt is now requesting to speak with her daughter.

## 2013-11-04 DIAGNOSIS — Z228 Carrier of other infectious diseases: Secondary | ICD-10-CM

## 2013-11-04 DIAGNOSIS — F209 Schizophrenia, unspecified: Secondary | ICD-10-CM

## 2013-11-04 NOTE — Consult Note (Signed)
[facility]    Date of Admission:  10/30/2013  Date of Consult:  11/04/2013  Reason for Consult: Colonization of Urine with CRE vs UTI Referring Physician: Dr. Radford Pax   HPI: Theresa Wiley is an 53 y.o. female with past medical history significant for schizophrenia he was admitted to the emergency department from her skilled nursing facility do to an acute psychotic episode with delusions of someone trying to kill her. Patient is also suffering from hallucinations. She was brought in emergent apartment and then urinalysis and reflex culture were done as part of a workup for delirium.  The patient herself has had no dysuria no hematuria no suprapubic pain she has had no flank pain no abdominal pain no systemic symptoms of nausea or vomiting or malaise or muscle aches. She has been without fevers.  Urine culture done here at [facility] has yielded a carbapenem resistant Klebsiella pneumonia species, that is resistant to nearly all antibiotics available with the exception of tetracycline possibly fosfomycin and IV colistin.  We are consult will by ER staff with concerns about infection prevention policies and about how to treat or whether to treat this bacteria.  The patient has been given ceftriaxone and then ciprofloxacin both of which have 0 activity against this organism. She has actually improved while she's been in the ER and while her psychosis has been treated. She has been without any fevers whatsoever. She continues to have no symptoms that would suggest a urinary tract infection  Beyond her acute psychotic episode which is much more simply explained by her underlying psychiatric condition and problems with access to her meds per her Daughter, Archie Patten.     Past Medical History  Diagnosis Date  . Chronic respiratory failure   . UTI (urinary tract infection)   . CHF (congestive heart failure)   . Ambulatory dysfunction   . Symbolic dysfunction   .  Schizophrenia   . Dysphagia   . Diabetes mellitus without complication   . Generalized muscle weakness   . Hypertension   . Hyperlipidemia   . Obesity   . Intellectual disability   . Seizures     Past Surgical History  Procedure Laterality Date  . Tracheostomy    . Endotracheal intubation emergent    ergies:   No Known Allergies   Medications: I have reviewed patients current medications as documented in Epic Anti-infectives   Start     Dose/Rate Route Frequency Ordered Stop   11/01/13 2000  ciprofloxacin (CIPRO) tablet 500 mg  Status:  Discontinued     500 mg Oral 2 times daily 11/01/13 1545 11/03/13 2009   11/01/13 1645  cefTRIAXone (ROCEPHIN) injection 1 g  Status:  Discontinued     1 g Intramuscular Every 24 hours 11/01/13 1634 11/02/13 0722   11/01/13 1545  cefTRIAXone (ROCEPHIN) 1 g in dextrose 5 % 50 mL IVPB  Status:  Discontinued     1 g 100 mL/hr over 30 Minutes Intravenous Every 24 hours 11/01/13 1544 11/01/13 1634   11/01/13 1545  ciprofloxacin (CIPRO) tablet 500 mg  Status:  Discontinued     500 mg Oral  Once 11/01/13 1544 11/01/13 1544      Social History:  reports that she has never smoked. She does not have any smokeless tobacco history on file. She reports that she does not drink alcohol. Her drug history is not on file.  History reviewed. No pertinent family history.  As in HPI and  primary teams notes otherwise 12 point review of systems is negative  Blood pressure 115/76, pulse 85, temperature 97.8 F (36.6 C), temperature source Oral, resp. rate 20, SpO2 100.00%. General: Alert and awake, oriented x1 , not in any acute distress. HEENT:EOMI, oropharynx clear and without exudate CVS regular rate, normal r,  no murmur rubs or gallops Chest: clear to auscultation bilaterally, no wheezing, rales or rhonchi Abdomen: soft nontender, nondistended, normal bowel sounds, Extremities: no  clubbing or edema noted bilaterally Skin: no rashes Neuro: nonfocal,  strength and sensation intact   Results for orders placed during the hospital encounter of 10/30/13 (from the past 48 hour(s))  GLUCOSE, CAPILLARY     Status: None   Collection Time    11/03/13  7:23 AM      Result Value Range   Glucose-Capillary 93  70 - 99 mg/dL      Component Value Date/Time   SDES URINE, RANDOM 10/30/2013 1621   SPECREQUEST NONE 10/30/2013 1621   CULT  Value: KLEBSIELLA PNEUMONIAE Note: Confirmatory test for carbapenemase production is positive. COLISTIN=0.094 ETEST results for this drug are "FOR INVESTIGATIONAL USE ONLY" and should NOT be used for clinical purposes. CRITICAL RESULT CALLED TO, READ BACK BY AND VERIFIED WITH: LAURA  STINES ON 1.17.15 @ 7:15pm BY FERGK Performed at [facility] 10/30/2013 1621   REPTSTATUS 11/03/2013 FINAL 10/30/2013 1621   No results found.   Recent Results (from the past 720 hour(s))  URINE CULTURE     Status: None   Collection Time    10/30/13  4:21 PM      Result Value Range Status   Specimen Description URINE, RANDOM   Final   Special Requests NONE   Final   Culture  Setup Time     Final   Value: 10/31/2013 01:01     Performed at [facility]   Colony Count     Final   Value: >=100,000 COLONIES/ML     Performed at [facility]   Culture     Final   Value: KLEBSIELLA PNEUMONIAE     Note: Confirmatory test for carbapenemase production is positive. COLISTIN=0.094 ETEST results for this drug are "FOR INVESTIGATIONAL USE ONLY" and should NOT be used for clinical purposes. CRITICAL RESULT CALLED TO, READ BACK BY AND VERIFIED WITH: Seabron SpatesLAURA      STINES ON 1.17.15 @ 7:15pm BY FERGK     Performed at [facility]   Report Status 11/03/2013 FINAL   Final   Organism ID, Bacteria KLEBSIELLA PNEUMONIAE   Final     Impression/Recommendation  53 year old lady with schizophrenia which has been poorly controlled with multiple admissions for acute psychotic episodes who has had her urine analyzed and culture  as part of workup for delirium this had yielded CRE in urine which seems HIGHLY likely to be a COLONIZER rather than agent of active infection   #1 Asymptomatic colonization with CRE:   From talking to her daughter it sounds as if this has been done repeatedly and had repeatedly resulted in her being diagnosed with urinary tract infections and having received multiple courses of antibiotics. I suspect this has driven her flora towards this now multidrug resistant organism. If she TRULY was with UTI causing delirium from CRE in urine she would have many more symptoms such as fevers dysuria abdominal pain flank pain and her clinical condition would've worsened while in the ER and while receiving an no antibiotics with any activity against this organism.  I DO NOT RECOMMEND TREATING HER AT THIS TIME  I WOULD STRONGLY DIS-COURAGE GETTING UA AND CULTURE WITH THIS SPECIFIC PT IN FUTURE WHEN SHE IS EVALUATED FOR ACUTE PSYCHOTIC EPISODES THIS IS LEADING TO MIS AND OVERDIAGNOSIS OF UTI AND LED TO HER CRE COLONIZATION  IF WE CAN AVOID GIVING HER ABX HER FLORA IN URINE MAY CHANGE BACK TO LESS RESISTANT ONES THAT CAN BE MORE EASILY TREATED WHEN AND IF SHE SHOWS EVIDENCE OF AN ACTUAL INFECTION  I spent greater than 60 minutes with the patient including greater than 50% of time in face to face counsel of the patient and in coordination of their care. And phone conversation with relative   #2 Screening: I do not see that she has ever been screened at [facility] for HIV or hepatitis viruses  I will order an HIV RNA for morning labs and hepatitis panel    Thank you so much for this interesting consult  We will followup labs we have ordered and are happy to advise further or see pt as followup if needed.     [facility] [phone] (pager) [phone] (office) 11/04/2013, 1:19 PM  Paulette Blanch Dam 11/04/2013, 1:19 PM

## 2013-11-04 NOTE — Progress Notes (Signed)
Weekend CSW electronically sent patient paperwork to [facility] and [facility] as requested by U.S. Bancorpsister/HCPOA Betty. CSW to continue following for discharge to ALF ([facility]- no PASARR needed) on Monday 11/05/13.  Samuella BruinKristin Tyon Cerasoli, MSW, LCSWA Clinical Social Worker [facility] Emergency Dept. [phone]

## 2013-11-04 NOTE — Progress Notes (Signed)
Weekend CSW informed by RN that patient has CRE infection and is on isolation precautions at this time. Patient may return to ALF, but may need private room if ALF deems necessary. Patient apparently came from facility with this infection. CSW will discuss this with administration/admissions coordinator when back in office on Monday 11/05/13.  Samuella BruinKristin Manila Rommel, MSW, LCSWA Clinical Social Worker [facility] Emergency Dept. [phone]

## 2013-11-05 NOTE — ED Notes (Signed)
Patient remains sitting in recliner in front of the computer.   I can't get to computer in patient's room to document.  Documentation done at nurses station.  Did not wake patient to document.

## 2013-11-05 NOTE — ED Notes (Signed)
Patient continues to want to get up to bathe and get dressed  Instructed that it is not 5am yet

## 2013-11-05 NOTE — ED Notes (Signed)
Pharmacy called for klonopin.

## 2013-11-05 NOTE — Progress Notes (Deleted)
CSW spoke with Wandra MannanZack Brooks AD SW and was informed of a potential [place]. The potential placement is not a [facility] provider that piece will need to be facilitated. AD SW will call [facility] on 11/06/13 regarding placement. CSW called pt.'s mother, Shaaron AdlerLourdes Arce [phone]) to update her on the status of son's placement. .Pt.'s mother asked about the health and well being of son. CSW answered questions. Pt.'s mother was appreciative of care being received while at hospital but continues to be upset that pt  [facility].   CSW suggested that pt.'s mother speak with [facility] Coordinator at [facility], Nicoletta DressPatricia Wilson regarding pt.'s placement. Pt.'s mother though it was a good idea. Contact information given. CSW will continue to follow up.    [address]Anihya Tuma, [place] [phone]

## 2013-11-05 NOTE — ED Notes (Signed)
Family unable to get pt this pm and provide care for her.  Ms. Theresa Wiley to speak with her family to see if a plan can be made for her care.  Ms. Theresa Wiley gave CSW permission to find a bed for pt "anywhere" I could.  [place].  APS report made.  Ms. Theresa Wiley aware and unhappy with this action.  CSW will continue to follow for disposition.

## 2013-11-05 NOTE — Progress Notes (Addendum)
ED CM  In room to meet with patient patient sitting up in chair asking for clothes to leave. Pt [facility]  [facility] , Ptr has a hx. schizophrenia . According to family she had become progressively worse in 1.5 months.  Pt family informed of the transfer process. [facility] pending initiating process  By family for transfer. Apparently the family showed up in the middle of the night and retrieved patient's belongings and told staff the patient would not be returning. [facility] no longer held the bed. When SW called to inform of returning to [facility] they were inform they could not take patient back. Pt is positive  for CRE.   CSW now in process of finding placement. CSW continues to look for SNF and ALF placement. No CM needs identified.

## 2013-11-05 NOTE — Progress Notes (Signed)
CSW spoke with Crisoforo OxfordBetty Currie, [facility] and asked her to fax HCPOA paperwork. HCPOA paperwork on pt.'s  chart. CSW sent out FL2 to the [place] for SNF and/or ALF placement upon request of HCPOA.. CSW explained the placement process to [facility] who voiced understanding. [facility] not [facility]. CSW will inform evening ED SW of the progress thus far. Will continue to work with family for placement. FL2 sent out to 21 facilities.   [address] Diniz, [place], [phone]

## 2013-11-05 NOTE — ED Notes (Signed)
Patient computer still blocked.  Charted at nurses station once patient was cleaned up and placed back in recliner in room.

## 2013-11-05 NOTE — ED Notes (Signed)
Patient remains in recliner in front of computer.   Unable to chart on computer in room.  Charted at desk.

## 2013-11-05 NOTE — ED Notes (Signed)
Pt requesting "breathing machine" pt placed on 2L [place] and encouraged to slow breathing. Pt responds to commands.

## 2013-11-05 NOTE — ED Notes (Signed)
Numerous calls on the call bell.  Patient is requesting her clothing and wants to take a bath.  She states she will wait until 5am to get her bath.

## 2013-11-05 NOTE — ED Notes (Signed)
[facility]  Large BM and urinated.  Cleaned and back to the bed

## 2013-11-05 NOTE — ED Notes (Signed)
Patient restless wanting to get up.  [place] sandwich and drink given upon request.

## 2013-11-05 NOTE — Progress Notes (Addendum)
ED CM received incoming call from sister Serena CroissantBetty Curry POA [phone] in regards placement. Spoke with sister, discussed that patient does not require an impatient hospitalization, and asked about  patient returning home with family until family could establish long term placement. She states, the family is unable to take patient home and provide 24 hour care. She understands that APS will be contacted to assist. She states, she will contact patient's daughters and discuss a plan for placement with them. Instructed her to contact ED CSW . She verbalized understanding.

## 2013-11-06 LAB — GLUCOSE, CAPILLARY: GLUCOSE-CAPILLARY: 74 mg/dL (ref 70–99)

## 2013-11-06 LAB — HIV-1 RNA QUANT-NO REFLEX-BLD
HIV 1 RNA Quant: <20 copies/mL (ref <20)
HIV-1 RNA Quant, Log: <1.3 {Log} (ref <1.30)

## 2013-11-06 NOTE — Progress Notes (Signed)
Writer was informed by social work Aeronautical engineer(Doris Best) that there is a bed offer for placement for this patient, pending the results of her TB Test.  CSW (Doris Best) reports that the family will transport the patient to the facility.

## 2013-11-06 NOTE — ED Notes (Signed)
PT'S SISTER, BETTY CURRIE, NOTIFIED THAT PT IS READY FOR D/C.  BETTY STATED THEY HAVE UNTIL THIS EVENING TO PICK HER UP.

## 2013-11-06 NOTE — ED Notes (Signed)
CBG-74. Notified RN

## 2013-11-06 NOTE — ED Notes (Signed)
[facility] at [facility] on the telephone talking to family.

## 2013-11-06 NOTE — Social Work (Signed)
On yesterday evening I was notified that [name]'s POA requested to speak with me. I did speak to the POA on yesterday evening and this morning. Ms Theresa Wiley [facility](POA) was notified that Ms. Theresa Wiley would be discharged today. Her family has been provided ALF resources and will follow up with placement from the community. Ms. Theresa Wiley will identify members in her family to contact for transportation home this evening.  [name], Marine scientistLCSW Assistant Director Social Work [facility]

## 2013-11-06 NOTE — Progress Notes (Signed)
Patients family are aware of discharge plan.No Case Manager needs identified.

## 2013-11-06 NOTE — Progress Notes (Signed)
Writer left a voice mail message for the CSW Pollyann Savoy(Jody Drake) regarding the status of the referrals that were faxed yesterday.

## 2013-11-06 NOTE — Discharge Instructions (Signed)
Take your medications. Follow up with primary care doctor in the next few days for recheck. Follow up with your mental health provider in the next few days.  Return to ER if worse, new symptoms, fevers, other concern.        Schizophrenia Schizophrenia is a mental illness. It may cause disturbed or disorganized thinking, speech, or behavior. People with schizophrenia have problems functioning in one or more areas of life: work, school, home, or relationships. People with schizophrenia are at increased risk for suicide, certain chronic physical illnesses, and unhealthy behaviors, such as smoking and drug use. People who have family members with schizophrenia are at higher risk of developing the illness. Schizophrenia affects men and women equally but usually appears at an earlier age (teenage or early adult years) in men.  SYMPTOMS The earliest symptoms are often subtle (prodrome) and may go unnoticed until the illness becomes more severe (first-break psychosis). Symptoms of schizophrenia may be continuous or may come and go in severity. Episodes often are triggered by major life events, such as family stress, college, [employer], marriage, pregnancy or child birth, divorce, or loss of a loved one. People with schizophrenia may see, hear, or feel things that do not exist (hallucinations). They may have false beliefs in spite of obvious proof to the contrary (delusions). Sometimes speech is incoherent or behavior is odd or withdrawn.  DIAGNOSIS Schizophrenia is diagnosed through an assessment by your caregiver. Your caregiver will ask questions about your thoughts, behavior, mood, and ability to function in daily life. Your caregiver may ask questions about your medical history and use of alcohol or drugs, including prescription medication. Your caregiver may also order blood tests and imaging exams. Certain medical conditions and substances can cause symptoms that resemble schizophrenia. Your  caregiver may refer you to a mental health specialist for evaluation. There are three major criterion for a diagnosis of schizophrenia:  Two or more of the following five symptoms are present for a month or longer:  Delusions. Often the delusions are that you are being attacked, harassed, cheated, persecuted or conspired against (persecutory delusions).  Hallucinations.   Disorganized speech that does not make sense to others.  Grossly disorganized (confused or unfocused) behavior or extremely overactive or underactive motor activity (catatonia).  Negative symptoms such as bland or blunted emotions (flat affect), loss of will power (avolition), and withdrawal from social contacts (social isolation).  Level of functioning in one or more major areas of life (work, school, relationships, or self-care) is markedly below the level of functioning before the onset of illness.   There are continuous signs of illness (either mild symptoms or decreased level of functioning) for at least 6 months or longer. TREATMENT  Schizophrenia is a long-term illness. It is best controlled with continuous treatment rather than treatment only when symptoms occur. The following treatments are used to manage schizophrenia:  Medication Medication is the most effective and important form of treatment for schizophrenia. Antipsychotic medications are usually prescribed to help manage schizophrenia. Other types of medication may be added to relieve any symptoms that may occur despite the use of antipsychotic medications.  Counseling or talk therapy Individual, group, or family counseling may be helpful in providing education, support, and guidance. Many people with schizophrenia also benefit from social skills and job skills (vocational) training. A combination of medication and counseling is best for managing the disorder over time. A procedure in which electricity is applied to the brain through the scalp  (electroconvulsive therapy) may  be used to treat catatonic schizophrenia or schizophrenia in people who cannot take or do not respond to medication and counseling. Document Released: 10/01/2000 Document Revised: 06/06/2013 Document Reviewed: 12/27/2012 [facility] Patient Information 2014 [place], [place].        Emergency Department Resource Guide 1) Find a Doctor and Pay Out of Pocket Although you won't have to find out who is covered by your insurance plan, it is a good idea to ask around and get recommendations. You will then need to call the office and see if the doctor you have chosen will accept you as a new patient and what types of options they offer for patients who are self-pay. Some doctors offer discounts or will set up payment plans for their patients who do not have insurance, but you will need to ask so you aren't surprised when you get to your appointment.  2) Contact Your Local Health Department Not all health departments have doctors that can see patients for sick visits, but many do, so it is worth a call to see if yours does. If you don't know where your local health department is, you can check in your phone book. The CDC also has a tool to help you locate your state's health department, and many state websites also have listings of all of their local health departments.  3) Find a Walk-in Clinic If your illness is not likely to be very severe or complicated, you may want to try a walk in clinic. These are popping up all over the country in pharmacies, drugstores, and shopping centers. They're usually staffed by nurse practitioners or physician assistants that have been trained to treat common illnesses and complaints. They're usually fairly quick and inexpensive. However, if you have serious medical issues or chronic medical problems, these are probably not your best option.  No Primary Care Doctor: - Call Health Connect at  [phone] - they can help you locate a primary care  doctor that  accepts your insurance, provides certain services, etc. - Physician Referral Service- [phone]  Chronic Pain Problems: Organization         Address  Phone   Notes  [facility]  [phone] Patients need to be referred by their primary care doctor.   Medication Assistance: Organization         Address  Phone   Notes  [facility] Medication [facility] [address] [place], [place] [address] [phone] --Must be a resident of [facility] -- Must have NO insurance coverage whatsoever (no Medicaid/ Medicare, etc.) -- The pt. MUST have a primary care doctor that directs their care regularly and follows them in the community   [facility]  [phone]   [employer]  [phone]    Agencies that provide inexpensive medical care: Organization         Address  Phone   Notes  [facility]  [phone]   [facility]    [phone]   [facility] [address] [place], [place] [address] [phone]   [facility] [address], [place] [phone]   Planned Parenthood    [phone]   [facility]    [phone]   [facility]  [address], [place] Phone:  [phone], Fax:  [phone] Hours of Operation:  9 am - 6 pm, M-F.  Also accepts Medicaid/Medicare  and self-pay.  [facility]  [address], [place] Phone: [phone], Fax: [phone]. Hours of Operation:  8:30 am - 5:30 pm, M-F.  Also accepts Medicaid and self-pay.  [facility] [place] [address] [place] Phone: [phone]   [facility] [address] Natasha Bence [place], [place] [phone], Ext. [phone] Mondays & Thursdays: 7-9 AM.  First 15 patients are seen on a first come, first serve basis.    Medicaid-accepting [facility]  Providers:  Organization         Address  Phone   Notes  [facility] [address], [place] [phone] Also accepts self-pay patients.  [facility] [address] Laurell Josephs [place], [place]  [phone]   [facility] [place] [address], [place] [phone]   [facility] [address], [place] [phone]   Renaye Rakers [address], [place]   [phone] Only accepts [place] patients after they have their name applied to their card.   Self-Pay (no insurance) in [facility]:  Organization         Address  Phone   Notes  Sickle Cell Patients, [facility] [address] [place], [place] [phone]   [facility] [address] [place], [place] [phone]   [facility] [place]  [address], [place] [phone]   [facility]/Dr. Osei-Bonsu  [address] [place] or [address], [place] [phone] Phone number for both [place] and [place] locations is the same.  [facility] [address], [place] [phone]   [facility] [address] [place] or [address] [phone]   [facility] [address], [place] [phone], phone; [phone], fax Sees patients 1st and 3rd Saturday of every month.  Must not qualify for public or private insurance (i.e. Medicaid, Medicare, [place] Health Choice, Veterans' Benefits)  Household income should be no more than 200% of the poverty level The clinic cannot treat you if you are pregnant or think you are pregnant  Sexually transmitted diseases are not treated at the clinic.    Dental Care: Organization         Address  Phone  Notes  [facility] Department of [facility] [address] [place], [place] [phone] Accepts children up to age 29 who are enrolled in [place] or [place] Health Choice; pregnant women with a Medicaid card; and children who have applied for Medicaid or [place] Health Choice, but were declined, whose parents can pay a reduced fee at time of service.  [facility] of [facility]  [address], [place] [phone] Accepts children up to age 59 who are enrolled in [place] or [place] Health Choice; pregnant women with a Medicaid card; and children who have applied for Medicaid or  Health Choice, but were declined, whose parents can pay a reduced fee at time of service.  [facility]  [address] [place], [place] [phone] Patients are seen by appointment only. Walk-ins are not accepted. [facility] will see patients 9 years of age and older. Monday - Tuesday (8am-5pm) Most Wednesdays (  8:30-5pm) $30 per visit, cash only  [facility]  [address], [place] [phone] Patients are seen by appointment only. Walk-ins are not accepted. [facility] will see patients 54 years of age and older. One Wednesday Evening (Monthly: Volunteer Based).  $30 per visit, cash only  [facility]  [phone] for adults; Children under age 234, call Graduate Pediatric Dentistry at [phone]. Children aged 714-14, please call [phone] to request a pediatric application.  Dental services are provided in all areas of dental care including fillings, crowns and bridges, complete and partial dentures, implants, gum treatment, root canals, and extractions. Preventive care is also provided. Treatment is provided to both adults and children. Patients are selected via a lottery and there is often a waiting list.   [facility] [address], [place]  [phone] [email or web address]   [facility]  [address], [place], [place] [phone], Ext. [phone] Second and Fourth Thursday of each month, opens at 6:30 AM; Clinic ends at 9 AM.  Patients are seen on a first-come first-served basis, and a limited number are seen during each clinic.   [facility]  [address], [place], [place] [phone]   Eligibility Requirements You must have lived in [place], [place], or [place] for at least the last three months.   You cannot be eligible for state or federal sponsored [place] Cityhealthcare insurance, including CIG[employer]eterans Administration, [place]icaid, or [employer]icare.   You generally cannot be eligible for healthcare insurance through your employer.    How to apply: Eligibility screenings are held every Tuesday and Wednesday afternoon from 1:00 pm until 4:00 pm. You do not need an appointment for the interview!  [facility] [address], [place], [place] [phone]   [facility]  [phone]   [facility]  [phone]   [facility]  [phone]    Behavioral Health Resources in the Community: Intensive Outpatient Programs Organization         Address  Phone  Notes  [facility] [address], [place], [place] [phone]   [facility] [address], [place], [place] [phone]   [facility] [address], [place], [place]  [phone]   [facility] [address],  [place], [place] [phone] or [phone]   Substance Abuse Resources Organization         Address  Phone  Notes  [facility]  [phone]   [facility]  [phone]   [facility]  [phone]   [facility]  [phone]   Residential & Outpatient Substance Abuse Program  [phone]   Psychological Services Organization         Address  Phone  Notes  [facility]  [phone]- [phone]    [facility]  [phone]   [facility] [address], [place] [phone] or [phone]    Mobile Crisis Teams Organization         Address  Phone  Notes  [facility], Mobile Crisis Care Unit  [phone]   [facility]  [address] [place], [place] [phone]   Doristine LocksSharon DeEsch [address] [place] [phone]    Self-Help/Support Groups Organization         Address  Phone  Notes  [facility] [place] [facility] - variety of support groups  [phone]- [phone] Call for more information  Narcotics Anonymous (NA), [facility] [address], [place]  2 meetings at this location   Statisticianesidential Treatment Programs Organization         Address  Phone  Notes  [facility] [address],    [place]  [phone]   [facility]  [address], [place] [phone], [place], [place] [phone]   [facility] [address], [place] [phone] Admissions: 8am-3pm M-F  [facility] [address]    [place], [place] [phone]   [facility] [address], [place], [place] [phone]   [facility] [address]  [place], [place] [phone]   [facility] - Intensive Outpatient [address], [place], [place] [phone]   [facility] ([facility] [address]  [place], [place] [phone] or [phone]   [facility] ([facility]) [address] [place], [place] [phone] Accepts Medicaid  [facility] [address]  [place] [phone] Substance Abuse/Addiction Treatment   [facility] Resources Organization         Address  Phone  Notes  [facility]  [phone]   Angie FavaJulie Brannon, PhD [address], [address] [place], [place]   [phone] or [phone]) [phone]    [facility]    [address] [place], [place] [phone](336) [phone]   [facility] [address], [place], [place] [phone] Insurance/Medicaid/sponsorship through [facility]  [facility] [address]                                    [place], [place] [phone] Therapy/tele-psych/case  [facility] [address]   [place], [place] [phone]    Dr. Lolly MustacheArfeen  [phone]   [facility] [place]  [employer] [facility] 1) [address], [place] 2) [address], [place] 3)  [address], [place] [phone]  [phone]) [phone]   [facility] Child Abuse Hotline [phone]) [phone] or [phone] (After Hours)

## 2013-11-06 NOTE — ED Notes (Signed)
TONYA Gavin - DAUGHTER CONTACT NO. [id number]. SHE CAN BE REACH ON THIS TEL. FOR UPDATES ON PT. PLACEMENT STATUS.

## 2013-11-06 NOTE — ED Notes (Signed)
Patient is alert and orientedx4.  Patient was explained discharge instructions and they understood them with no questions.  The patient's [facility].

## 2013-11-06 NOTE — Progress Notes (Signed)
CSW spoke with pt.'s Crisoforo OxfordBetty Currie, HCPOA to reiterate discharge plan for pt. HCPOA voiced understanding. HCPOA asked CSW to fax FL2 to [facility] [place], [place] and [facility] in [place], [place]. [facility] was able to make a bed offer and is asking for TB and PSARR information to be faxed. CSW will also fax pt.'s medication list. HCPOA made aware of bed offer. HCPOA reported that a family member will arrive at 3 pm to [facility].     [address]is Bronislaw Switzer, [place] [phone]

## 2013-11-06 NOTE — ED Notes (Signed)
Had to document on computer at nurses station, patient is sitting in recliner in front of computer in her room.

## 2013-11-06 NOTE — Progress Notes (Signed)
Physical Therapy Treatment and Discharge Patient Details Name: Theresa Wiley MRN: [id number] DOB: January 12, 1961 Today's Date: 11/06/2013 Time: [id number] PT Time Calculation (min): 22 min  PT Assessment / Plan / Recommendation  History of Present Illness Patient is a 53 yo female with history of paranoid schizophrenia to the ER from the assisted living facility where she resides for increased agitation and aggression. Patient reportedly has had a significant decline over the past 1-1/2 months. She has been experiencing active hallucinations and increased agitation.    PT Comments   Pt cooperative and able to ambulate with supervision (needed only due to unfamiliar environment and for encouragement). Spoke with York RamZach Brooks, SW who had confirmed pt's prior level of function with staff at prior residence (which was assisted living, not skilled nursing facility). Pt with no further skilled PT needs and PT is signing off.   Follow Up Recommendations  No PT follow up     Does the patient have the potential to tolerate intense rehabilitation     Barriers to Discharge        Equipment Recommendations  None recommended by PT    Recommendations for Other Services    Frequency     Progress towards PT Goals Progress towards PT goals:  (No goals set (or can be found); pt at baseline; d/c PT)  Plan Discharge plan needs to be updated;Equipment recommendations need to be updated    Precautions / Restrictions     Pertinent Vitals/Pain Denied pain    Mobility  Transfers Overall transfer level: Modified independent Equipment used: Rolling walker (2 wheeled) Transfers: Sit to/from Stand Sit to Stand: Modified independent (Device/Increase time) Ambulation/Gait Ambulation/Gait assistance: Supervision Ambulation Distance (Feet): 100 Feet Assistive device: Rolling walker (2 wheeled) Gait Pattern/deviations: Step-through pattern;Decreased stride length Gait velocity: Slow gait speed General Gait  Details: supervision for directional cues only (pt in unfamiliar environment)    Exercises     PT Diagnosis:    PT Problem List:   PT Treatment Interventions:     PT Goals (current goals can now be found in the care plan section)    Visit Information  Last PT Received On: 11/06/13 Assistance Needed: +1 History of Present Illness: Patient is a 53 yo female with history of paranoid schizophrenia to the ER from the assisted living facility where she resides for increased agitation and aggression. Patient reportedly has had a significant decline over the past 1-1/2 months. She has been experiencing active hallucinations and increased agitation.     Subjective Data  Subjective: Reports she used a RW at the facility where she resided   [employer]  Cognition Arousal/Alertness: Awake/alert Behavior During Therapy: WFL for tasks assessed/performed (tearful when asking to use phone to call her sister) Overall Cognitive Status: No family/caregiver present to determine baseline cognitive functioning Current Attention Level: Selective    Balance  General Comments General comments (skin integrity, edema, etc.): Spoke with York RamZach Brooks, SW who was in contact with pt's prior facility. This was an assisted living level of care and the staff reported pt ambulated throughout facility on her own.   End of Session PT - End of Session Equipment Utilized During Treatment: Gait belt Activity Tolerance: Patient tolerated treatment well Patient left: in chair;with call bell/phone within reach Nurse Communication: Mobility status;Other (comment) (RW left outside pt's door for nursing to use)   GP Functional Assessment Tool Used: Clinical judgement Functional Limitation: Mobility: Walking and moving around Mobility: Walking and Moving Around Goal Status [phone]([id number]): At least  1 percent but less than 20 percent impaired, limited or restricted Mobility: Walking and Moving Around Discharge Status [phone]): At least  1 percent but less than 20 percent impaired, limited or restricted   Patient is being discharged from PT services secondary to: Patient is at baseline with no further skilled PT needs.    Please see latest Therapy Progress Note (above) for current level of functioning and progress toward goals.   Progress and discharge plan and discussed with patient/caregiver and they: No caregivers available Patient unable to participate in discharge planning    Advika Mclelland 11/06/2013, 12:02 PM Pager [phone]

## 2013-11-06 NOTE — ED Provider Notes (Addendum)
Pt alert, content, nad. Vitals normal. Discussed w psych/sw team - placement remains pending.   Suzi RootsKevin E Waynetta Metheny, MD 11/06/13 [phone]  Psych team indicates pt psychiatrically cleared for d/c.  Spoke with psych team and sw manager who states pt discussed amongst care team, and they have spoken w pts/family/poa, and explained plan for d/c from ED, they need to pick up patient from ED, and that resources/referrals provided to family should they want to pursue new ECF.  Pt alert. Content. Vitals normal. Appears medically stable for d/c - staff to have family pick up pt from ED.    Suzi RootsKevin E Evo Aderman, MD 11/06/13 [phone]

## 2013-11-07 LAB — HEPATITIS PANEL, ACUTE
HCV AB: NEGATIVE
HEP B C IGM: NONREACTIVE
Hep A IgM: NONREACTIVE
Hepatitis B Surface Ag: NEGATIVE

## 2013-11-07 LAB — RPR: RPR: NONREACTIVE

## 2013-11-07 NOTE — Progress Notes (Signed)
CSW left message for Marliss Cootsarrell Griffin, Supervisor for [facility] [phone]) to inform him of placement for pt. Pt. was accepted at [facility] [place] No follow up needed.    [address], [place] [phone]

## 2015-11-22 IMAGING — CR DG ABDOMEN ACUTE W/ 1V CHEST
4 series · 4 of 4 positions shown · non-contrast
Comparison: None.

CLINICAL DATA: Abdominal pain.

EXAM:
ACUTE ABDOMEN SERIES (ABDOMEN 2 VIEW & CHEST 1 VIEW)

[w abdomen decub]
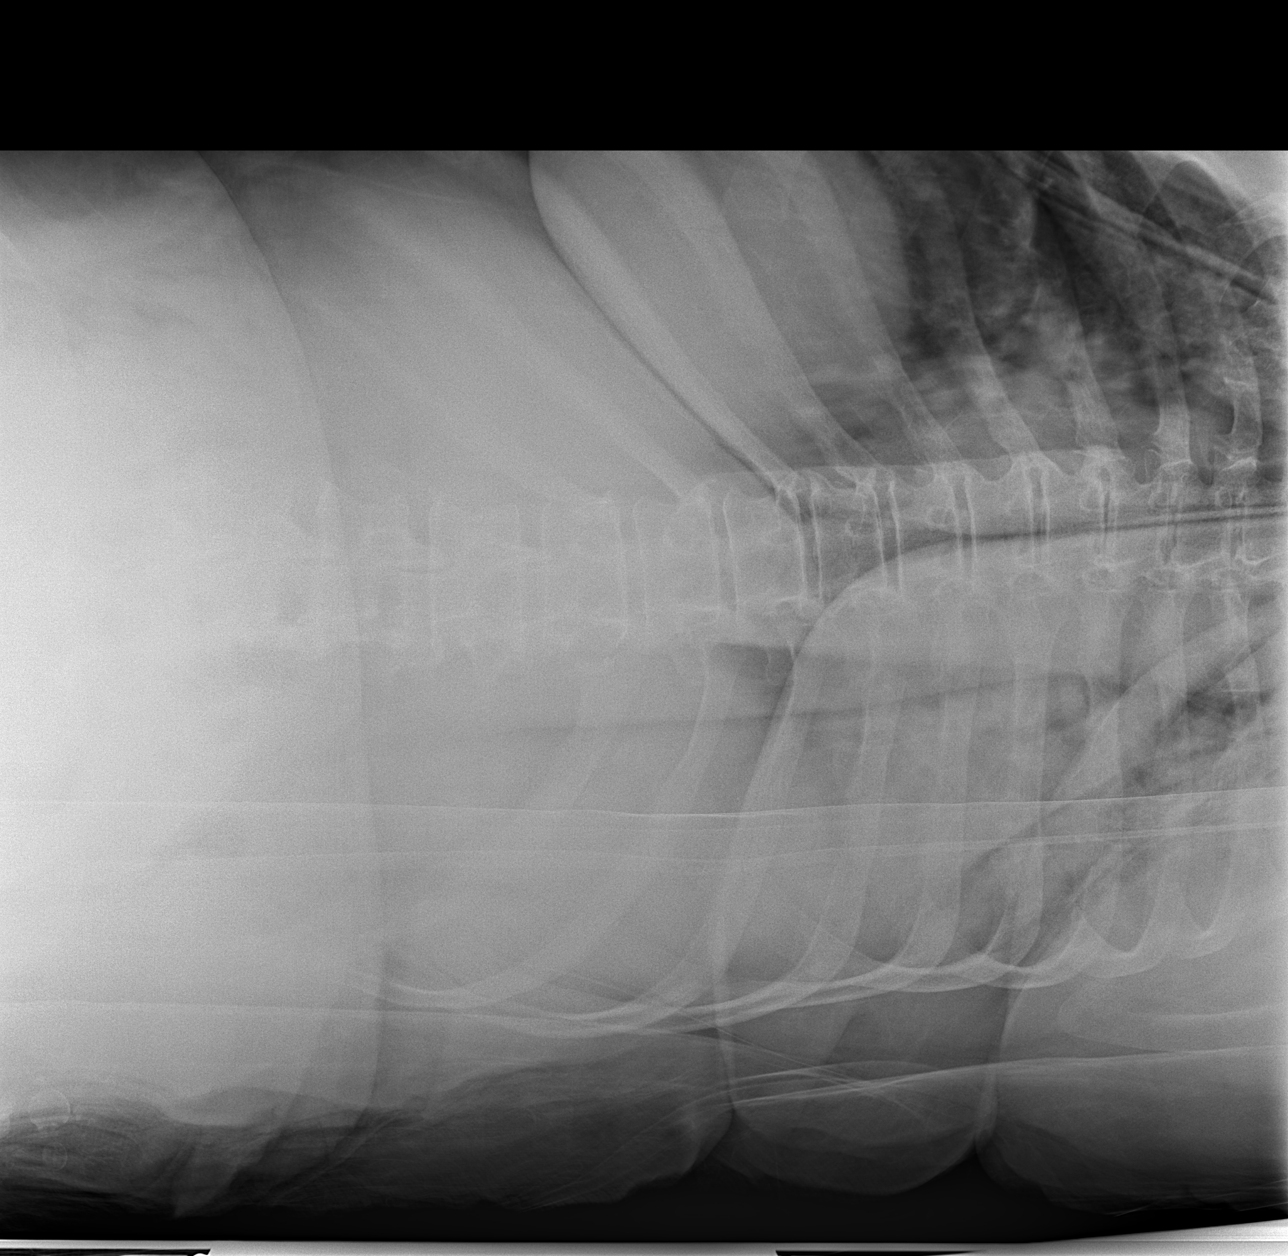

[t abdomen supine (1 of 2)]
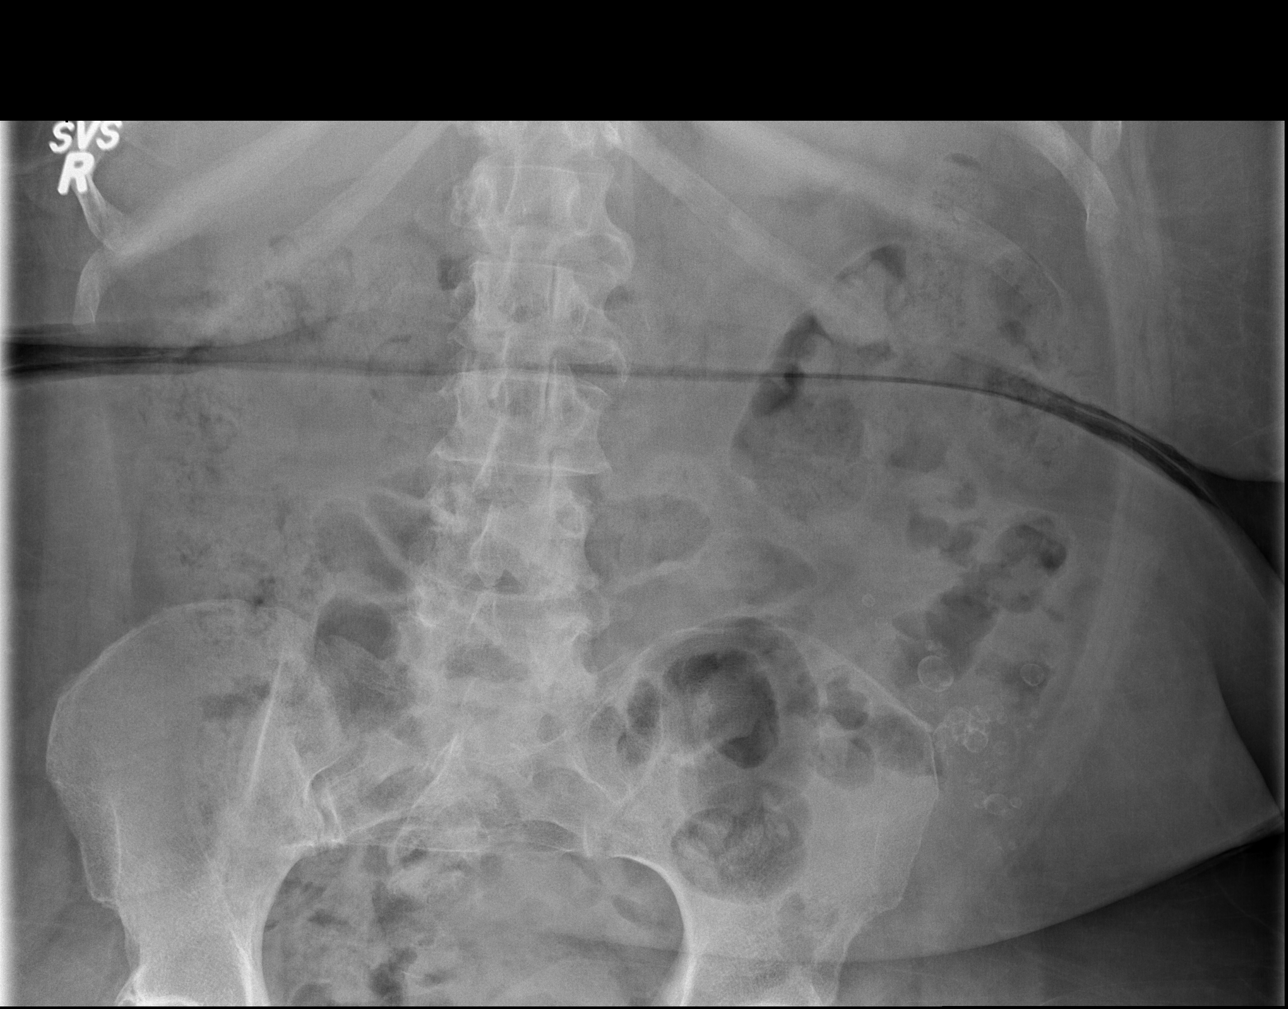

[t abdomen supine (2 of 2)]
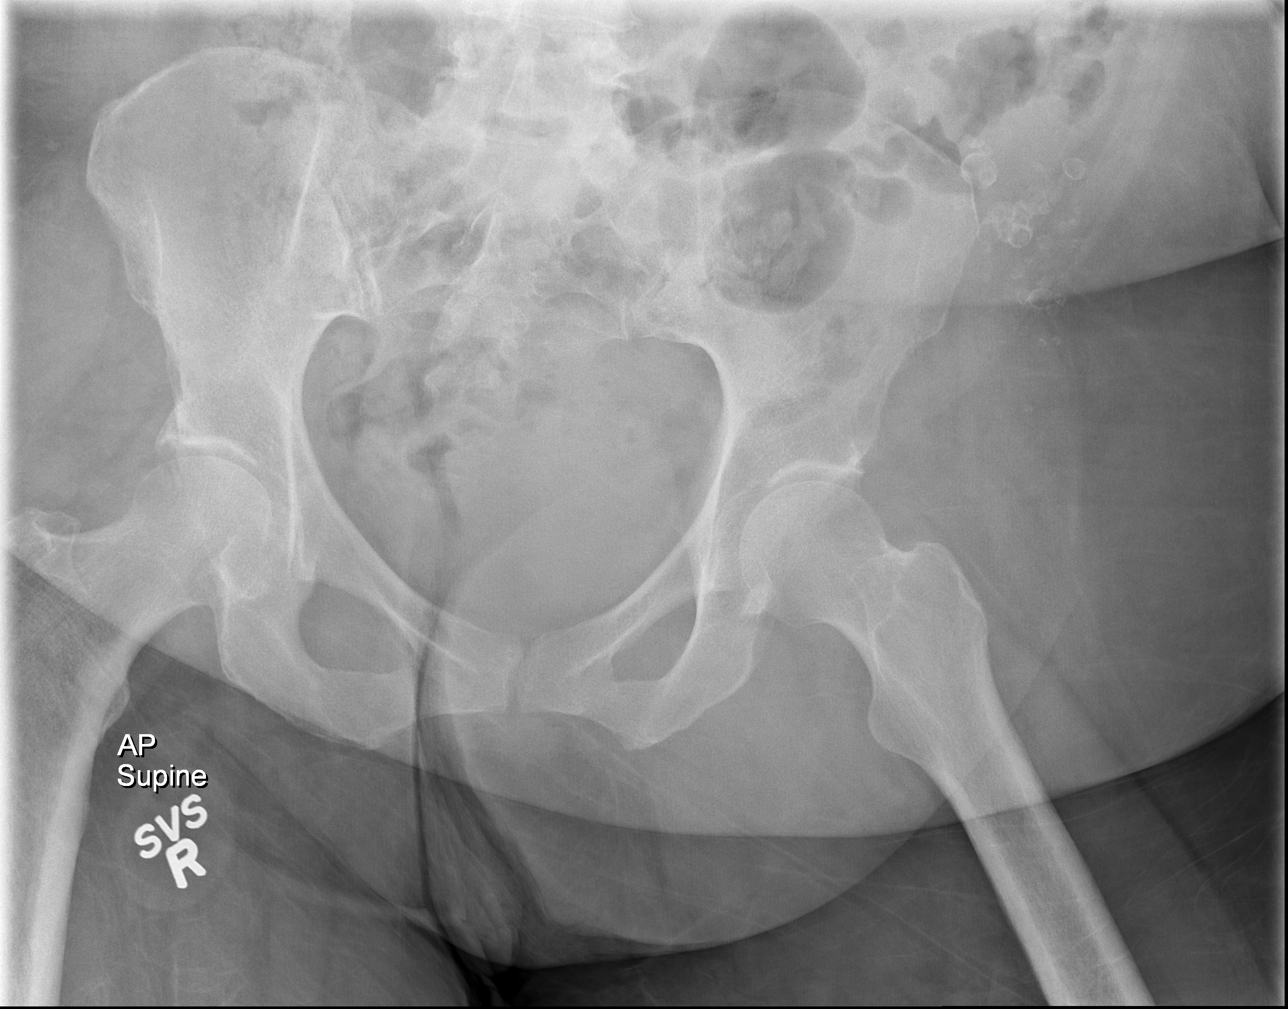

[x chest ap]
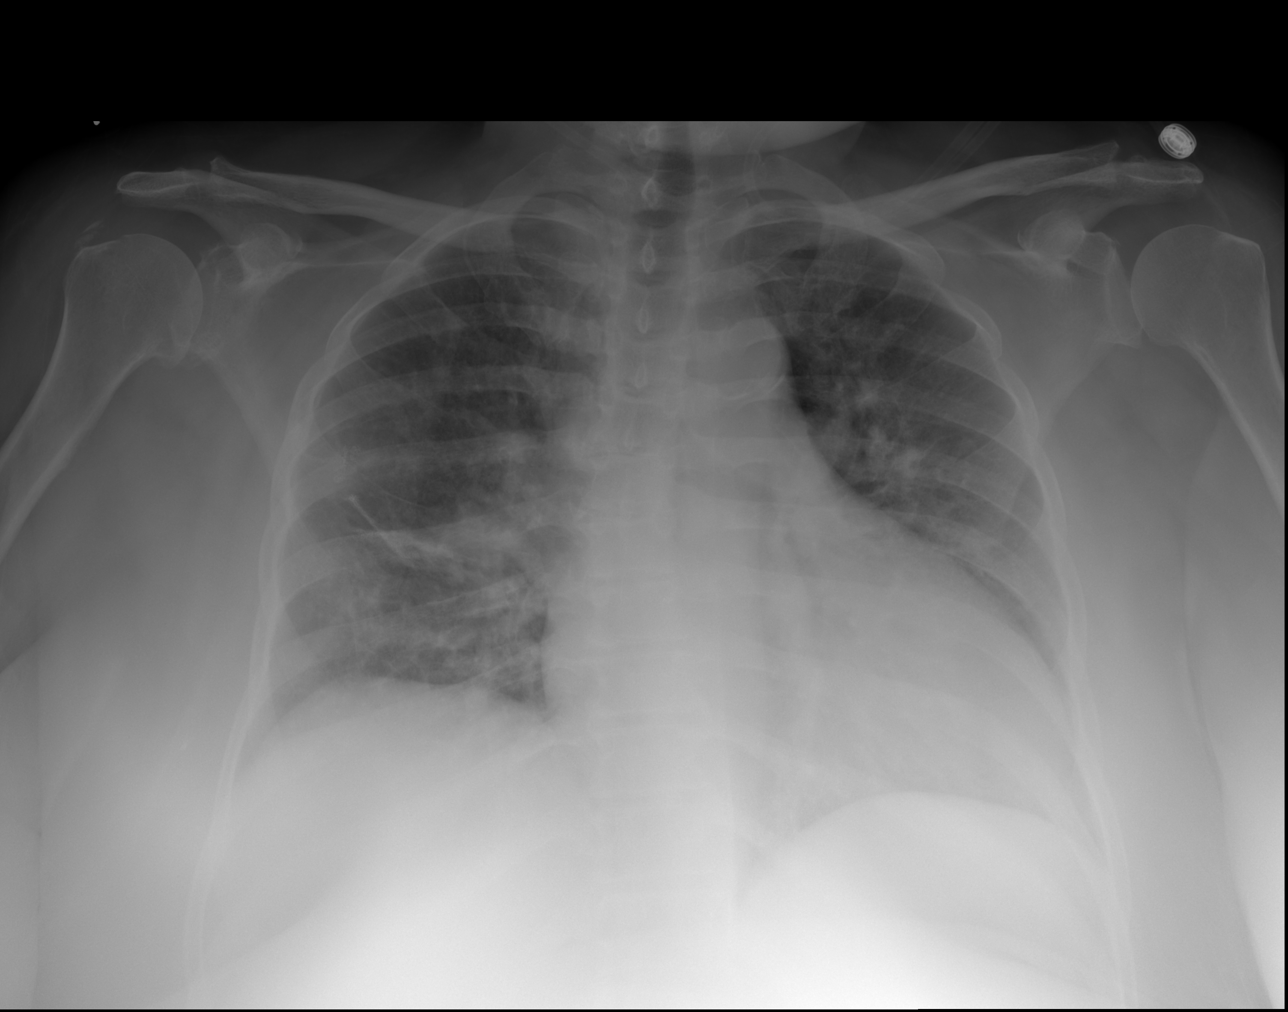

[4 of 4 positions shown; findings below may reference images not displayed]

FINDINGS: Cardiomegaly with vascular congestion. Diffuse interstitial
prominence throughout the lungs may reflect interstitial edema. No
effusions.

Nonobstructive bowel gas pattern. Moderate stool burden throughout
the colon. No free air organomegaly. No suspicious calcifications.
No acute bony abnormality.
IMPRESSION: Moderate stool burden. No evidence of bowel obstruction or free air.

Cardiomegaly with vascular congestion and possible interstitial
edema.

## 2015-12-03 IMAGING — CR DG CHEST 1V PORT
1 series · 1 of 1 positions shown · non-contrast
Comparison: Chest in two views abdomen 10/19/2013.

CLINICAL DATA: Shortness of breath.

EXAM:
PORTABLE CHEST - 1 VIEW

[AP]
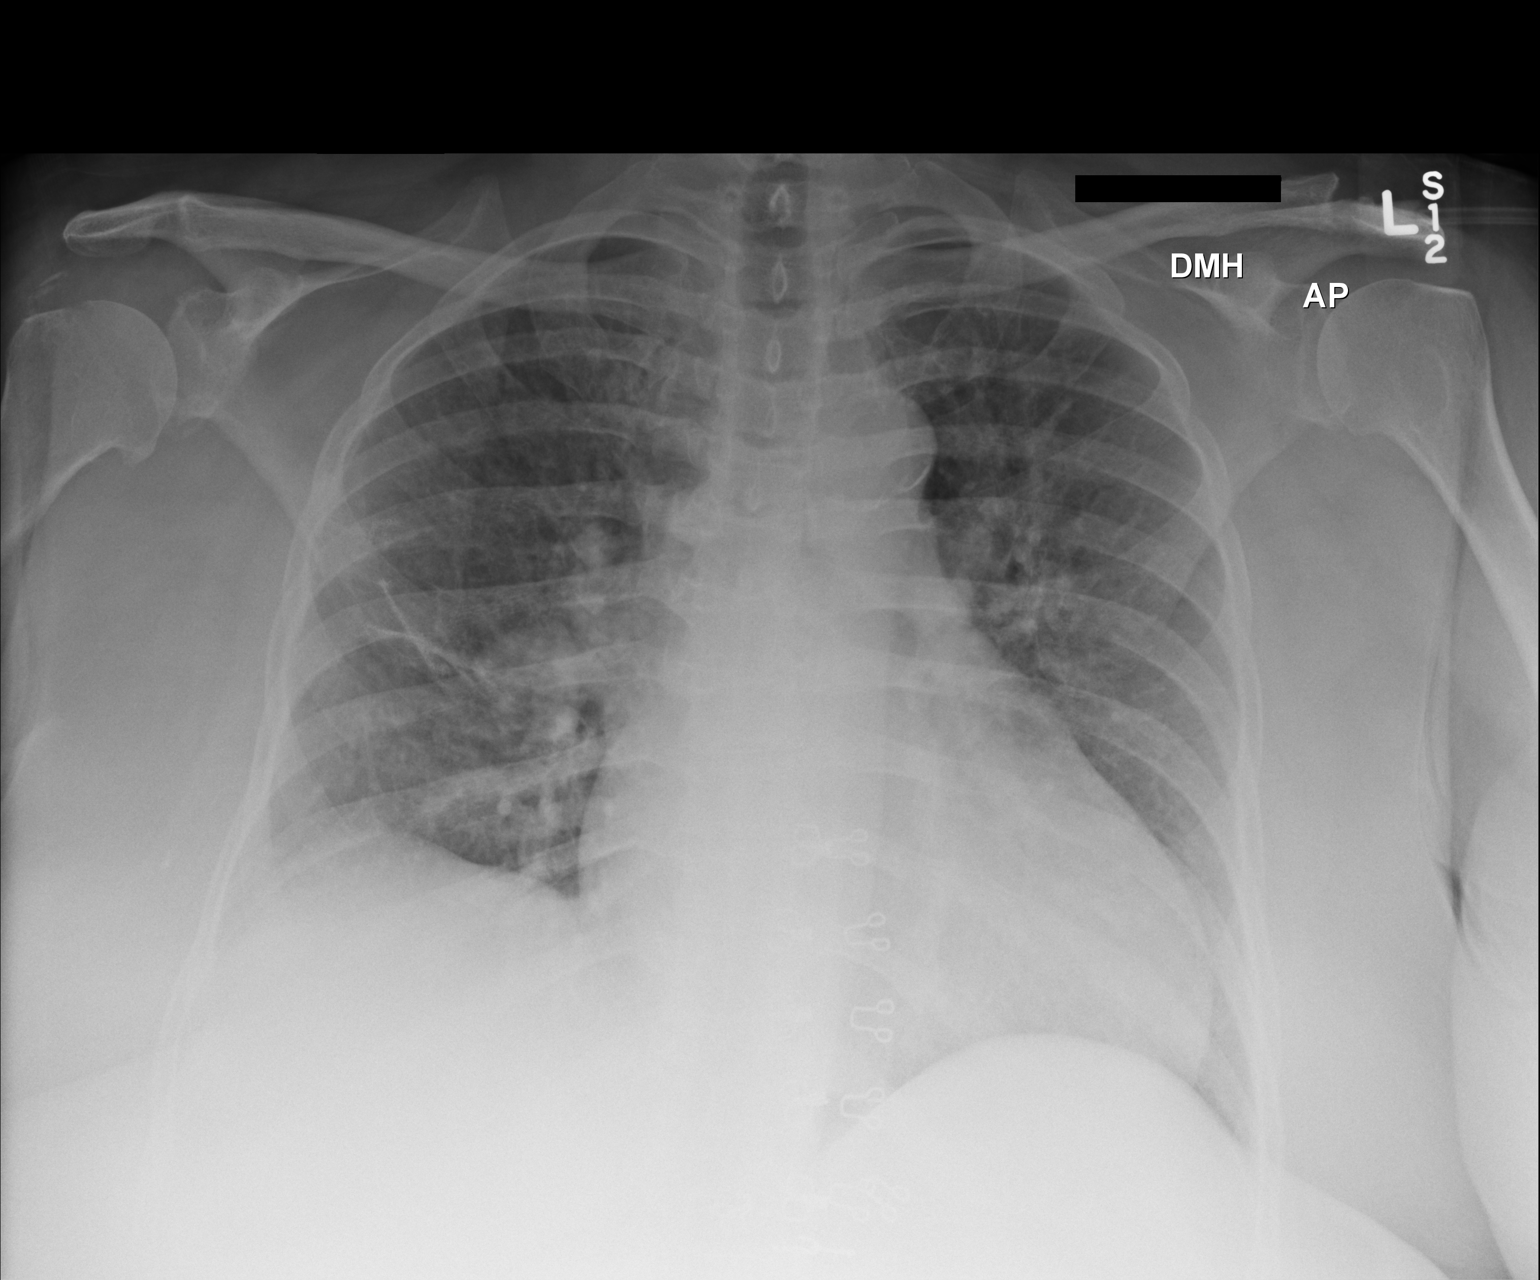

[1 of 1 positions shown; findings below may reference images not displayed]

FINDINGS: Cardiomegaly and vascular congestion are again seen. There is no
consolidative process, pneumothorax or effusion. Scar in the right
mid lung is unchanged.
IMPRESSION: Cardiomegaly and vascular congestion.  No acute finding.

## 2016-07-18 DEATH — deceased
